# Patient Record
Sex: Male | Born: 1990 | Race: Black or African American | Hispanic: No | Marital: Single | Smoking: Former smoker
Health system: Southern US, Community
[De-identification: ages and names within clinical notes are randomized; demographics above are authoritative.]

## PROBLEM LIST (undated history)

## (undated) DIAGNOSIS — F329 Major depressive disorder, single episode, unspecified: Secondary | ICD-10-CM

## (undated) DIAGNOSIS — Z21 Asymptomatic human immunodeficiency virus [HIV] infection status: Secondary | ICD-10-CM

## (undated) DIAGNOSIS — F32A Depression, unspecified: Secondary | ICD-10-CM

## (undated) DIAGNOSIS — F4310 Post-traumatic stress disorder, unspecified: Secondary | ICD-10-CM

## (undated) HISTORY — DX: Major depressive disorder, single episode, unspecified: F32.9

## (undated) HISTORY — DX: Depression, unspecified: F32.A

## (undated) HISTORY — DX: Post-traumatic stress disorder, unspecified: F43.10

---

## 2015-12-29 ENCOUNTER — Encounter (HOSPITAL_COMMUNITY): Payer: Self-pay | Admitting: Emergency Medicine

## 2015-12-29 ENCOUNTER — Emergency Department (HOSPITAL_COMMUNITY)
Admission: EM | Admit: 2015-12-29 | Discharge: 2015-12-29 | Disposition: A | Discharge status: Home / Self Care | Payer: Self-pay | Attending: Emergency Medicine | Admitting: Emergency Medicine

## 2015-12-29 ENCOUNTER — Emergency Department (HOSPITAL_COMMUNITY): Payer: Self-pay

## 2015-12-29 DIAGNOSIS — R1110 Vomiting, unspecified: Secondary | ICD-10-CM | POA: Insufficient documentation

## 2015-12-29 DIAGNOSIS — R059 Cough, unspecified: Secondary | ICD-10-CM

## 2015-12-29 DIAGNOSIS — R0789 Other chest pain: Secondary | ICD-10-CM | POA: Insufficient documentation

## 2015-12-29 DIAGNOSIS — R197 Diarrhea, unspecified: Secondary | ICD-10-CM | POA: Insufficient documentation

## 2015-12-29 DIAGNOSIS — R0989 Other specified symptoms and signs involving the circulatory and respiratory systems: Secondary | ICD-10-CM

## 2015-12-29 DIAGNOSIS — Z21 Asymptomatic human immunodeficiency virus [HIV] infection status: Secondary | ICD-10-CM | POA: Insufficient documentation

## 2015-12-29 DIAGNOSIS — R05 Cough: Secondary | ICD-10-CM | POA: Insufficient documentation

## 2015-12-29 DIAGNOSIS — R053 Chronic cough: Secondary | ICD-10-CM

## 2015-12-29 DIAGNOSIS — R0601 Orthopnea: Secondary | ICD-10-CM

## 2015-12-29 DIAGNOSIS — F17200 Nicotine dependence, unspecified, uncomplicated: Secondary | ICD-10-CM | POA: Insufficient documentation

## 2015-12-29 DIAGNOSIS — R109 Unspecified abdominal pain: Secondary | ICD-10-CM | POA: Insufficient documentation

## 2015-12-29 HISTORY — DX: Asymptomatic human immunodeficiency virus (hiv) infection status: Z21

## 2015-12-29 LAB — CBC
HCT: 46 % (ref 39.0–52.0)
Hemoglobin: 15.5 g/dL (ref 13.0–17.0)
MCH: 30.5 pg (ref 26.0–34.0)
MCHC: 33.7 g/dL (ref 30.0–36.0)
MCV: 90.4 fL (ref 78.0–100.0)
PLATELETS: 266 10*3/uL (ref 150–400)
RBC: 5.09 MIL/uL (ref 4.22–5.81)
RDW: 12.6 % (ref 11.5–15.5)
WBC: 7.8 10*3/uL (ref 4.0–10.5)

## 2015-12-29 LAB — URINALYSIS, ROUTINE W REFLEX MICROSCOPIC
Bilirubin Urine: NEGATIVE
GLUCOSE, UA: NEGATIVE mg/dL
HGB URINE DIPSTICK: NEGATIVE
KETONES UR: 15 mg/dL — AB
LEUKOCYTES UA: NEGATIVE
Nitrite: NEGATIVE
PROTEIN: NEGATIVE mg/dL
Specific Gravity, Urine: 1.031 — ABNORMAL HIGH (ref 1.005–1.030)
pH: 7 (ref 5.0–8.0)

## 2015-12-29 LAB — COMPREHENSIVE METABOLIC PANEL
ALK PHOS: 102 U/L (ref 38–126)
ALT: 47 U/L (ref 17–63)
AST: 35 U/L (ref 15–41)
Albumin: 4.2 g/dL (ref 3.5–5.0)
Anion gap: 9 (ref 5–15)
BUN: 14 mg/dL (ref 6–20)
CALCIUM: 9.3 mg/dL (ref 8.9–10.3)
CO2: 24 mmol/L (ref 22–32)
CREATININE: 1.24 mg/dL (ref 0.61–1.24)
Chloride: 106 mmol/L (ref 101–111)
Glucose, Bld: 100 mg/dL — ABNORMAL HIGH (ref 65–99)
Potassium: 3.6 mmol/L (ref 3.5–5.1)
Sodium: 139 mmol/L (ref 135–145)
Total Bilirubin: 0.7 mg/dL (ref 0.3–1.2)
Total Protein: 7.2 g/dL (ref 6.5–8.1)

## 2015-12-29 LAB — LIPASE, BLOOD: Lipase: 27 U/L (ref 11–51)

## 2015-12-29 MED ORDER — DIPHENOXYLATE-ATROPINE 2.5-0.025 MG PO TABS: ORAL_TABLET | ORAL | Status: DC

## 2015-12-29 NOTE — Discharge Instructions (Signed)

## 2015-12-29 NOTE — ED Provider Notes (Signed)
CSN: [id number]     Arrival date & time 12/29/15  2041 History  By signing my name below, I, Jeremy Ortiz, attest that this documentation has been prepared under the direction and in the presence of Jeremy Ortiz, [place].  Electronically Signed: Tanda Ortiz, ED Scribe. 12/29/2015. 10:27 PM.   Chief Complaint  Patient presents with  . Emesis  . Diarrhea  . Cough   The history is provided by the patient. No language interpreter was used.     HPI Comments: Jeremy Ortiz is a 25 y.o. male with PMHx HIV who presents to the Emergency Department complaining of gradual onset, constant, productive cough x 2 weeks. Pt mentions that he recently had the flu and has been having the cough ever since. He is having chest tightness that is exacerbated with deep inspirations. He also complains of intermittent abdominal pain and vomiting that occurs only after eating. Pt has been having diarrhea for the pat 2 weeks as well. No recent antibiotic use. Pt has HIV but is new to the area from [place] and has not been able to get in to see a provider at [facility]. He last had his CD4 and viral load checked in December 2016. His CD4 count was in the 800's but he report he was not told a level for his viral load. Denies any other associated symptoms. Pt is current everyday smoker who smokes 1 ppd. No hx thrush or pneumonias. He has FHx of DM and stroke.   Past Medical History  Diagnosis Date  . HIV positive (HCC)    History reviewed. No pertinent past surgical history. No family history on file. Social History  Substance Use Topics  . Smoking status: Current Every Day Smoker  . Smokeless tobacco: None  . Alcohol Use: No    Review of Systems  Constitutional: Negative for fever.  Respiratory: Positive for cough and chest tightness.   Gastrointestinal: Positive for vomiting, abdominal pain and diarrhea.  All other systems reviewed and are negative.  Allergies  Review of patient's allergies  indicates no known allergies.  Home Medications   Prior to Admission medications   Not on File   BP 137/99 mmHg  Pulse 85  Temp(Src) 99.3 F (37.4 C) (Oral)  Resp 20  Ht  (1.702 m)  Wt 203 lb 1 oz (92.109 kg)  BMI 31.80 kg/m2  SpO2 99%   Physical Exam  Constitutional: He is oriented to person, place, and time. He appears well-developed and well-nourished. No distress.  HENT:  Head: Normocephalic and atraumatic.  Eyes: Conjunctivae and EOM are normal.  Neck: Neck supple. No tracheal deviation present.  Cardiovascular: Normal rate and regular rhythm.   Pulmonary/Chest: Effort normal and breath sounds normal. No respiratory distress. He has no wheezes. He has no rhonchi. He has no rales.  Musculoskeletal: Normal range of motion.  Neurological: He is alert and oriented to person, place, and time.  Skin: Skin is warm and dry.  Psychiatric: He has a normal mood and affect. His behavior is normal.  Nursing note and vitals reviewed.   ED Course  Procedures (including critical care time)  DIAGNOSTIC STUDIES: Oxygen Saturation is 99% on RA, normal by my interpretation.    COORDINATION OF CARE: 10:23 PM-Discussed treatment plan with pt at bedside and pt agreed to plan.   Labs Review Labs Reviewed  COMPREHENSIVE METABOLIC PANEL - Abnormal; Notable for the following:    Glucose, Bld 100 (*)    All other components within  normal limits  URINALYSIS, ROUTINE W REFLEX MICROSCOPIC (NOT AT [facility]) - Abnormal; Notable for the following:    Specific Gravity, Urine 1.031 (*)    Ketones, ur 15 (*)    All other components within normal limits  LIPASE, BLOOD  CBC    Imaging Review Dg Chest 2 View  12/29/2015  CLINICAL DATA:  25 year old male with persistent cough and chest congestion EXAM: CHEST  2 VIEW COMPARISON:  None. FINDINGS: The heart size and mediastinal contours are within normal limits. Both lungs are clear. The visualized skeletal structures are unremarkable. IMPRESSION:  No active cardiopulmonary disease. Electronically Signed   By: Elgie CollardArash  Radparvar M.D.   On: 12/29/2015 21:26   I have personally reviewed and evaluated these images and lab results as part of my medical decision-making.   EKG Interpretation None      MDM   Final diagnoses:  Orthopnea  Chest congestion  Persistent cough   Lomotil rx Wanda Case management [facility] in ID clinic.   An After Visit Summary was printed and given to the patient. I personally performed the services in this documentation, which was scribed in my presence.  The recorded information has been reviewed and considered.   Barnet PallKaren SofiaPAC.     Lonia SkinnerLeslie K San SabaSofia, PA-C 12/29/15 2243  Benjiman CoreNathan Pickering, MD 12/30/15 [phone]

## 2015-12-29 NOTE — Care Management (Signed)
ED CM met with patient to assist with follow up at the [facility]. Patient states, he has tried to contact. [facility]. CM sent an in-basket message to [place] at [facility] to contact patient at [phone], .

## 2015-12-29 NOTE — ED Notes (Signed)
Pt. reports emesis , diarrhea and productive cough for 2 weeks , denies fever or chills .

## 2016-01-25 ENCOUNTER — Telehealth: Payer: Self-pay

## 2016-01-25 NOTE — Telephone Encounter (Signed)
Several attempts to reach patient via phone.   Today I left a message on his voicemail to call the office for new intake appointment.   Laurell Josephsammy K Tram Wrenn, RN

## 2016-01-31 ENCOUNTER — Emergency Department (HOSPITAL_COMMUNITY)
Admission: EM | Admit: 2016-01-31 | Discharge: 2016-01-31 | Disposition: A | Discharge status: Home / Self Care | Payer: Self-pay | Attending: Emergency Medicine | Admitting: Emergency Medicine

## 2016-01-31 ENCOUNTER — Encounter (HOSPITAL_COMMUNITY): Payer: Self-pay | Admitting: Emergency Medicine

## 2016-01-31 DIAGNOSIS — H53149 Visual discomfort, unspecified: Secondary | ICD-10-CM | POA: Insufficient documentation

## 2016-01-31 DIAGNOSIS — R519 Headache, unspecified: Secondary | ICD-10-CM

## 2016-01-31 DIAGNOSIS — R110 Nausea: Secondary | ICD-10-CM | POA: Insufficient documentation

## 2016-01-31 DIAGNOSIS — Z21 Asymptomatic human immunodeficiency virus [HIV] infection status: Secondary | ICD-10-CM | POA: Insufficient documentation

## 2016-01-31 DIAGNOSIS — F17210 Nicotine dependence, cigarettes, uncomplicated: Secondary | ICD-10-CM | POA: Insufficient documentation

## 2016-01-31 DIAGNOSIS — R51 Headache: Secondary | ICD-10-CM | POA: Insufficient documentation

## 2016-01-31 MED ORDER — METOCLOPRAMIDE HCL 5 MG/ML IJ SOLN
10.0000 mg | Freq: Once | INTRAMUSCULAR | Status: AC
  Administered 2016-01-31: 10 mg via INTRAVENOUS
  Filled 2016-01-31: qty 2

## 2016-01-31 MED ORDER — ACETAMINOPHEN 500 MG PO TABS
1000.0000 mg | ORAL_TABLET | Freq: Once | ORAL | Status: AC
  Administered 2016-01-31: 1000 mg via ORAL
  Filled 2016-01-31: qty 2

## 2016-01-31 MED ORDER — DIPHENHYDRAMINE HCL 50 MG/ML IJ SOLN
25.0000 mg | Freq: Once | INTRAMUSCULAR | Status: AC
  Administered 2016-01-31: 25 mg via INTRAVENOUS
  Filled 2016-01-31: qty 1

## 2016-01-31 MED ORDER — SODIUM CHLORIDE 0.9 % IV BOLUS (SEPSIS)
1000.0000 mL | Freq: Once | INTRAVENOUS | Status: AC
  Administered 2016-01-31: 1000 mL via INTRAVENOUS

## 2016-01-31 NOTE — ED Notes (Addendum)
Pt here for migraine that started 3 days ago. Pt has not tired any medications at home. Pt denies nuchal rigidity.

## 2016-01-31 NOTE — ED Provider Notes (Signed)
CSN: [id number]     Arrival date & time 01/31/16  1443 History  By signing my name below, I, Soijett Blue, attest that this documentation has been prepared under the direction and in the presence of Chadwin Fury Y. Brynn Reznik, PA-C Electronically Signed: Soijett Blue, ED Scribe. 01/31/2016. 3:29 PM.   Chief Complaint  Patient presents with  . Migraine      The history is provided by the patient. No language interpreter was used.    HPI Comments: Jeremy Ortiz is a 25 y.o. male with a medical hx of HIV who presents to the Emergency Department complaining of 9/10, constant, gradually worsening, bilateral temporal, HA onset 3 days. He reports that his HA is worsened with lights and denies any alleviating factors.  Pt states that his HA is causing him to have issues with keeping his eyes open due to the pain. Pt denies typically getting headaches. He states that he is having associated symptoms of nausea and photophobia. He states that he has not tried any medications for the relief for his symptoms. He denies vomiting, blurred vision, vision change, fever, chills, and any other symptoms. Follows ID at [facility]. Last viral load undetectable in 01/2016. Pt is unsure of his last CD4 count and his next appointment with ID is Friday, 02/04/2016. Pt states that he is still taking his HIV medications.    Past Medical History  Diagnosis Date  . HIV positive (HCC)    History reviewed. No pertinent past surgical history. History reviewed. No pertinent family history. Social History  Substance Use Topics  . Smoking status: Current Every Day Smoker -- 1.00 packs/day    Types: Cigarettes  . Smokeless tobacco: None  . Alcohol Use: No    Review of Systems  Constitutional: Negative for fever and chills.  Eyes: Positive for photophobia. Negative for visual disturbance.  Gastrointestinal: Positive for nausea. Negative for vomiting.  Neurological: Positive for headaches.  All other systems reviewed and are  negative.     Allergies  Review of patient's allergies indicates no known allergies.  Home Medications   Prior to Admission medications   Medication Sig Start Date End Date Taking? Authorizing Provider  diphenoxylate-atropine (LOMOTIL) 2.5-0.025 MG tablet One tablet po q 4 prn diarrhea 12/29/15   Lonia Skinner Sofia, PA-C   BP 122/72 mmHg  Pulse 78  Temp(Src) 97.9 F (36.6 C) (Oral)  Resp 15  Ht  (1.702 m)  Wt 198 lb (89.812 kg)  BMI 31.00 kg/m2  SpO2 98% Physical Exam  Constitutional: He is oriented to person, place, and time. He appears well-developed and well-nourished. No distress.  HENT:  Head: Normocephalic and atraumatic.  Eyes: EOM are normal.  Neck: Normal range of motion. Neck supple.  No meningismus.  Cardiovascular: Normal rate and normal heart sounds.   Pulmonary/Chest: Effort normal and breath sounds normal. No respiratory distress.  Abdominal: He exhibits no distension.  Musculoskeletal: Normal range of motion.  Neurological: He is alert and oriented to person, place, and time. He has normal reflexes. No cranial nerve deficit. Coordination normal.   No pronator drift. Nl finger-to-nose.  Skin: Skin is warm and dry.  Psychiatric: He has a normal mood and affect. His behavior is normal.  Nursing note and vitals reviewed.   ED Course  Procedures (including critical care time) DIAGNOSTIC STUDIES: Oxygen Saturation is 98% on RA, nl by my interpretation.    COORDINATION OF CARE: 3:28 PM Discussed treatment plan with pt at bedside which includes CT head and  pt agreed to plan.    Labs Review Labs Reviewed - No data to display  Imaging Review No results found. I have personally reviewed and evaluated these images as part of my medical decision-making.   EKG Interpretation None      MDM   Final diagnoses:  Acute nonintractable headache, unspecified headache type    Pt feels improved after headache cocktail with intact neuro exam and no  meningeal signs. Discussed case with attending MD Schlossman. Given well-controlled HIV with close ID follow up, no e/o infection or neurological deficits on exam, will hold off on further imaging/workup at this time. Instructed to keep ID f/u for this week. Strict ER return precautions given.  I personally performed the services described in this documentation, which was scribed in my presence. The recorded information has been reviewed and is accurate.   Carlene CoriaSerena Y Kayana Thoen, PA-C 01/31/16 1720  Alvira MondayErin Schlossman, MD 02/01/16 [phone]

## 2016-01-31 NOTE — ED Notes (Signed)
Patient out of the room stating "I am burning up".  PA made aware.  See MAR for new orders.

## 2016-01-31 NOTE — Discharge Instructions (Signed)
Please follow up [facility]ious [facility] this week as scheduled. Return to the ER for new or worsening symptoms.

## 2016-04-12 ENCOUNTER — Emergency Department (HOSPITAL_COMMUNITY)
Admission: EM | Admit: 2016-04-12 | Discharge: 2016-04-12 | Disposition: A | Discharge status: Home / Self Care | Payer: Self-pay | Attending: Emergency Medicine | Admitting: Emergency Medicine

## 2016-04-12 ENCOUNTER — Emergency Department (HOSPITAL_COMMUNITY): Payer: Self-pay

## 2016-04-12 ENCOUNTER — Encounter (HOSPITAL_COMMUNITY): Payer: Self-pay | Admitting: Nurse Practitioner

## 2016-04-12 DIAGNOSIS — R109 Unspecified abdominal pain: Secondary | ICD-10-CM

## 2016-04-12 DIAGNOSIS — K5900 Constipation, unspecified: Secondary | ICD-10-CM | POA: Insufficient documentation

## 2016-04-12 DIAGNOSIS — F17210 Nicotine dependence, cigarettes, uncomplicated: Secondary | ICD-10-CM | POA: Insufficient documentation

## 2016-04-12 LAB — CBC
HCT: 48.7 % (ref 39.0–52.0)
HEMOGLOBIN: 16.1 g/dL (ref 13.0–17.0)
MCH: 30.6 pg (ref 26.0–34.0)
MCHC: 33.1 g/dL (ref 30.0–36.0)
MCV: 92.6 fL (ref 78.0–100.0)
Platelets: 253 10*3/uL (ref 150–400)
RBC: 5.26 MIL/uL (ref 4.22–5.81)
RDW: 13.4 % (ref 11.5–15.5)
WBC: 6.7 10*3/uL (ref 4.0–10.5)

## 2016-04-12 LAB — URINALYSIS, ROUTINE W REFLEX MICROSCOPIC
Bilirubin Urine: NEGATIVE
Glucose, UA: NEGATIVE mg/dL
HGB URINE DIPSTICK: NEGATIVE
Ketones, ur: NEGATIVE mg/dL
NITRITE: NEGATIVE
Protein, ur: NEGATIVE mg/dL
SPECIFIC GRAVITY, URINE: 1.03 (ref 1.005–1.030)
pH: 6 (ref 5.0–8.0)

## 2016-04-12 LAB — COMPREHENSIVE METABOLIC PANEL
ALK PHOS: 84 U/L (ref 38–126)
ALT: 33 U/L (ref 17–63)
ANION GAP: 7 (ref 5–15)
AST: 26 U/L (ref 15–41)
Albumin: 3.9 g/dL (ref 3.5–5.0)
BUN: 10 mg/dL (ref 6–20)
CALCIUM: 9.1 mg/dL (ref 8.9–10.3)
CO2: 22 mmol/L (ref 22–32)
Chloride: 109 mmol/L (ref 101–111)
Creatinine, Ser: 0.96 mg/dL (ref 0.61–1.24)
GFR calc non Af Amer: >60 mL/min (ref >60)
Glucose, Bld: 106 mg/dL — ABNORMAL HIGH (ref 65–99)
POTASSIUM: 3.8 mmol/L (ref 3.5–5.1)
SODIUM: 138 mmol/L (ref 135–145)
Total Bilirubin: 0.4 mg/dL (ref 0.3–1.2)
Total Protein: 6.8 g/dL (ref 6.5–8.1)

## 2016-04-12 LAB — URINE MICROSCOPIC-ADD ON: RBC / HPF: NONE SEEN RBC/hpf (ref 0–5)

## 2016-04-12 LAB — I-STAT CG4 LACTIC ACID, ED: LACTIC ACID, VENOUS: 0.81 mmol/L (ref 0.5–1.9)

## 2016-04-12 LAB — LIPASE, BLOOD: LIPASE: 18 U/L (ref 11–51)

## 2016-04-12 MED ORDER — HYDROCODONE-ACETAMINOPHEN 5-325 MG PO TABS
1.0000 | ORAL_TABLET | ORAL | Status: DC | PRN
  Administered 2016-04-12: 1 via ORAL
  Filled 2016-04-12: qty 1

## 2016-04-12 MED ORDER — GI COCKTAIL ~~LOC~~
30.0000 mL | Freq: Once | ORAL | Status: AC
  Administered 2016-04-12: 30 mL via ORAL
  Filled 2016-04-12: qty 30

## 2016-04-12 MED ORDER — OMEPRAZOLE 20 MG PO CPDR: 20.0000 mg | DELAYED_RELEASE_CAPSULE | Freq: Every day | ORAL | Status: DC

## 2016-04-12 MED ORDER — SODIUM CHLORIDE 0.9 % IV BOLUS (SEPSIS)
1000.0000 mL | Freq: Once | INTRAVENOUS | Status: AC
  Administered 2016-04-12: 1000 mL via INTRAVENOUS

## 2016-04-12 NOTE — ED Provider Notes (Signed)
CSN: [id number]     Arrival date & time 04/12/16  1406 History   First MD Initiated Contact with Patient 04/12/16 1643     Chief Complaint  Patient presents with  . Abdominal Pain     (Consider location/radiation/quality/duration/timing/severity/associated sxs/prior Treatment) Patient is a 25 y.o. male presenting with abdominal pain.  Abdominal Pain Pain location:  Generalized Pain quality: gnawing and sharp   Pain radiates to:  Chest Pain severity:  Moderate Onset quality:  Gradual Duration:  5 days Timing:  Constant Progression:  Unchanged Chronicity:  New Context: not alcohol use   Relieved by:  Nothing Worsened by:  Eating Ineffective treatments:  Bowel activity, acetaminophen, eating and position changes (pepto bismol) Associated symptoms: anorexia, chest pain (heart burn), constipation, dysuria and fatigue   Associated symptoms: no chills, no cough, no diarrhea, no fever, no hematemesis, no hematochezia, no hematuria, no nausea and no shortness of breath     Past Medical History  Diagnosis Date  . HIV positive (HCC)    History reviewed. No pertinent past surgical history. History reviewed. No pertinent family history. Social History  Substance Use Topics  . Smoking status: Current Every Day Smoker -- 1.00 packs/day    Types: Cigarettes  . Smokeless tobacco: None  . Alcohol Use: No    Review of Systems  Constitutional: Positive for fatigue. Negative for fever, chills and diaphoresis.  HENT: Negative for congestion.   Eyes: Negative for visual disturbance.  Respiratory: Negative for cough, chest tightness and shortness of breath.   Cardiovascular: Positive for chest pain (heart burn).  Gastrointestinal: Positive for abdominal pain, constipation and anorexia. Negative for nausea, diarrhea, hematochezia and hematemesis.  Genitourinary: Positive for dysuria. Negative for hematuria, flank pain, penile pain and testicular pain.  Musculoskeletal: Negative for neck  pain.  Skin: Negative for rash.  Neurological: Negative for weakness, numbness and headaches.  Psychiatric/Behavioral: Negative for confusion and agitation.      Allergies  Review of patient's allergies indicates no known allergies.  Home Medications   Prior to Admission medications   Medication Sig Start Date End Date Taking? Authorizing Provider  abacavir-dolutegravir-lamiVUDine (TRIUMEQ) 600-50-300 MG tablet Take 1 tablet by mouth at bedtime. 01/14/16  Yes Historical Provider, MD  diphenoxylate-atropine (LOMOTIL) 2.5-0.025 MG tablet One tablet po q 4 prn diarrhea Patient not taking: Reported on 04/12/2016 12/29/15   Elson AreasLeslie K Sofia, PA-C  divalproex (DEPAKOTE) 250 MG DR tablet Take 250 mg by mouth 2 (two) times daily. 03/17/16   Historical Provider, MD  omeprazole (PRILOSEC) 20 MG capsule Take 1 capsule (20 mg total) by mouth daily. 04/12/16   Levora AngelEric Deven Furia, MD  traMADol (ULTRAM) 50 MG tablet Take 50 mg by mouth daily as needed for moderate pain.  03/10/16   Historical Provider, MD   BP 129/65 mmHg  Pulse 50  Temp(Src) 98.8 F (37.1 C) (Oral)  Resp 16  SpO2 100% Physical Exam  Constitutional: He is oriented to person, place, and time. He appears well-developed and well-nourished. No distress.  HENT:  Head: Normocephalic and atraumatic.  Eyes: Conjunctivae are normal.  Cardiovascular: Normal rate and normal heart sounds.   No murmur heard. Pulmonary/Chest: Effort normal and breath sounds normal.  Abdominal: Soft. He exhibits no distension. There is tenderness (mild generalized tenderness, equivocal Murphy sign). There is no rebound and no guarding.  Musculoskeletal: He exhibits no edema.  Neurological: He is alert and oriented to person, place, and time.  Skin: Skin is warm. He is not diaphoretic.  Psychiatric: He  has a normal mood and affect. His behavior is normal.  Nursing note and vitals reviewed.   ED Course  Procedures (including critical care time) Labs Review Labs  Reviewed  COMPREHENSIVE METABOLIC PANEL - Abnormal; Notable for the following:    Glucose, Bld 106 (*)    All other components within normal limits  URINALYSIS, ROUTINE W REFLEX MICROSCOPIC (NOT AT [facility]) - Abnormal; Notable for the following:    Leukocytes, UA SMALL (*)    All other components within normal limits  URINE MICROSCOPIC-ADD ON - Abnormal; Notable for the following:    Squamous Epithelial / LPF 0-5 (*)    Bacteria, UA FEW (*)    All other components within normal limits  CBC  LIPASE, BLOOD  T-HELPER CELLS (CD4) COUNT (NOT AT [facility])  I-STAT CG4 LACTIC ACID, ED  GC/CHLAMYDIA PROBE AMP ([place]) NOT AT [facility]    Imaging Review Dg Abd 1 View  04/12/2016  CLINICAL DATA:  Intermittent sharp abdominal pain and nausea over the last 5 days. EXAM: ABDOMEN - 1 VIEW COMPARISON:  None. FINDINGS: The bowel gas pattern is normal. No radio-opaque calculi or other significant radiographic abnormality are seen. IMPRESSION: Negative radiographs of the abdomen Electronically Signed   By: Marin Roberts M.D.   On: 04/12/2016 18:27   US Abdomen Limited Ruq  04/12/2016  CLINICAL DATA:  Abdominal pain. EXAM: US ABDOMEN LIMITED - RIGHT UPPER QUADRANT COMPARISON:  None. FINDINGS: Gallbladder: No gallstones or gallbladder wall thickening. No pericholecystic fluid. The sonographer reports no sonographic Murphy's sign. Common bile duct: Diameter: 2 mm Liver: No focal lesion identified. Within normal limits in parenchymal echogenicity. IMPRESSION: Normal right upper quadrant limited ultrasound. Electronically Signed   By: Kennith Center M.D.   On: 04/12/2016 18:34   I have personally reviewed and evaluated these images and lab results as part of my medical decision-making.   EKG Interpretation None      MDM   Final diagnoses:  Abdominal pain  Constipation, unspecified constipation type   Tamera Stands presents for 5 days waxing and waning abdominal pain. Generalized, sometimes made  worse by eating. RUQ ultrasound unremarkable. KUB shows no acute pathology with moderate stool burden. Pain not improved with GI cocktail. Abdominal and testicular exams are benign. Patient concerned symptoms could be related to HIV. Reports good medication compliance. CD4 levels sent. Urinalysis unremarkable - I'm unsure the etiology of the patient's dysuria. GC/Chlamydia urine probe pending, but holding empiric treatment at this time due to no history of penile discharge or known exposure. Patient able to ambulate without difficulty in ED. Discharged home in good condition with strict return precautions and instructions to follow up with PCP (information provided). Prescribed omeprazole for possible GERD and encouraged to use metamucil/miralax for constipation.      Levora Angel, MD 04/13/16 1436  Alvira Monday, MD 04/17/16 1442

## 2016-04-12 NOTE — Discharge Instructions (Signed)
Please return to the emergency department if your pain significantly worsens, you develope a fever, or your aren't able to eat. I recommend using a stool softener like MiraLAX or Metamucil to help with your bowel movements. I suspect a lot of your pain is due to constipation, but if the pain gets worse or won't stop even after you've had good bowel movements, we would like to see you back.   Abdominal Pain, Adult Many things can cause abdominal pain. Usually, abdominal pain is not caused by a disease and will improve without treatment. It can often be observed and treated at home. Your health care provider will do a physical exam and possibly order blood tests and X-rays to help determine the seriousness of your pain. However, in many cases, more time must pass before a clear cause of the pain can be found. Before that point, your health care provider may not know if you need more testing or further treatment. HOME CARE INSTRUCTIONS Monitor your abdominal pain for any changes. The following actions may help to alleviate any discomfort you are experiencing:  Only take over-the-counter or prescription medicines as directed by your health care provider.  Do not take laxatives unless directed to do so by your health care provider.  Try a clear liquid diet (broth, tea, or water) as directed by your health care provider. Slowly move to a bland diet as tolerated. SEEK MEDICAL CARE IF:  You have unexplained abdominal pain.  You have abdominal pain associated with nausea or diarrhea.  You have pain when you urinate or have a bowel movement.  You experience abdominal pain that wakes you in the night.  You have abdominal pain that is worsened or improved by eating food.  You have abdominal pain that is worsened with eating fatty foods.  You have a fever. SEEK IMMEDIATE MEDICAL CARE IF:  Your pain does not go away within 2 hours.  You keep throwing up (vomiting).  Your pain is felt only in  portions of the abdomen, such as the right side or the left lower portion of the abdomen.  You pass bloody or black tarry stools. MAKE SURE YOU:  Understand these instructions.  Will watch your condition.  Will get help right away if you are not doing well or get worse.   This information is not intended to replace advice given to you by your health care provider. Make sure you discuss any questions you have with your health care provider.   Document Released: 06/28/2005 Document Revised: 06/09/2015 Document Reviewed: 05/28/2013 Elsevier Interactive Patient Education [employer]2016 [employer]vier Inc.

## 2016-04-12 NOTE — ED Notes (Signed)
Pt inquiring about his status. Wanting to know what is going to happen. Pt advised that i was unsure but i would speak with the nurse. Rn informed

## 2016-04-12 NOTE — ED Notes (Signed)
Provider at bedside

## 2016-04-12 NOTE — ED Notes (Signed)
He c/o 5 day history of abd pain. Describes as constant pain with intermittent sharp pain. Reports nausea, dysuria. Denies vomiting, fevers. Reports he feels like he needs to have BM constantly but only has small stools. He is alert, breathing easily

## 2016-04-12 NOTE — ED Notes (Signed)
Provider made aware of lipase results

## 2016-04-13 LAB — T-HELPER CELLS (CD4) COUNT (NOT AT ARMC)
CD4 % Helper T Cell: 46 % (ref 33–55)
CD4 T CELL ABS: 1770 /uL (ref 400–2700)

## 2016-05-03 ENCOUNTER — Encounter: Payer: Self-pay | Admitting: Internal Medicine

## 2016-05-09 ENCOUNTER — Other Ambulatory Visit: Payer: Self-pay

## 2016-05-30 ENCOUNTER — Ambulatory Visit: Payer: Self-pay | Admitting: Internal Medicine

## 2016-09-06 ENCOUNTER — Telehealth: Payer: Self-pay | Admitting: Lab

## 2016-09-06 NOTE — Telephone Encounter (Signed)
Patient missed his appointment with Dr. Luciana Axeomer on 05/30/16.  I tried calling him 3x and the phone would ring a few times and then go to fast busy.  I left a message for [facility] to give me a call (xt [phone] on paperwork but there isn't a name)

## 2016-10-11 NOTE — Telephone Encounter (Signed)
Per [facility], [place]

## 2018-04-07 ENCOUNTER — Encounter (HOSPITAL_COMMUNITY): Payer: Self-pay | Admitting: Emergency Medicine

## 2018-04-07 ENCOUNTER — Emergency Department (HOSPITAL_COMMUNITY)
Admission: EM | Admit: 2018-04-07 | Discharge: 2018-04-08 | Disposition: A | Discharge status: Home / Self Care | Payer: Self-pay | Attending: Emergency Medicine | Admitting: Emergency Medicine

## 2018-04-07 ENCOUNTER — Emergency Department (HOSPITAL_COMMUNITY): Payer: Self-pay

## 2018-04-07 DIAGNOSIS — B20 Human immunodeficiency virus [HIV] disease: Secondary | ICD-10-CM | POA: Insufficient documentation

## 2018-04-07 DIAGNOSIS — Z9114 Patient's other noncompliance with medication regimen: Secondary | ICD-10-CM | POA: Insufficient documentation

## 2018-04-07 DIAGNOSIS — F17210 Nicotine dependence, cigarettes, uncomplicated: Secondary | ICD-10-CM | POA: Insufficient documentation

## 2018-04-07 DIAGNOSIS — Z79899 Other long term (current) drug therapy: Secondary | ICD-10-CM | POA: Insufficient documentation

## 2018-04-07 DIAGNOSIS — R05 Cough: Secondary | ICD-10-CM | POA: Insufficient documentation

## 2018-04-07 DIAGNOSIS — Z91148 Patient's other noncompliance with medication regimen for other reason: Secondary | ICD-10-CM

## 2018-04-07 DIAGNOSIS — R059 Cough, unspecified: Secondary | ICD-10-CM

## 2018-04-07 DIAGNOSIS — R609 Edema, unspecified: Secondary | ICD-10-CM | POA: Insufficient documentation

## 2018-04-07 DIAGNOSIS — R079 Chest pain, unspecified: Secondary | ICD-10-CM

## 2018-04-07 LAB — CBC
HEMATOCRIT: 46.2 % (ref 39.0–52.0)
HEMOGLOBIN: 14.7 g/dL (ref 13.0–17.0)
MCH: 29.9 pg (ref 26.0–34.0)
MCHC: 31.8 g/dL (ref 30.0–36.0)
MCV: 93.9 fL (ref 78.0–100.0)
Platelets: 246 10*3/uL (ref 150–400)
RBC: 4.92 MIL/uL (ref 4.22–5.81)
RDW: 12.8 % (ref 11.5–15.5)
WBC: 6.5 10*3/uL (ref 4.0–10.5)

## 2018-04-07 LAB — BASIC METABOLIC PANEL
ANION GAP: 7 (ref 5–15)
BUN: 10 mg/dL (ref 6–20)
CALCIUM: 9 mg/dL (ref 8.9–10.3)
CHLORIDE: 105 mmol/L (ref 98–111)
CO2: 24 mmol/L (ref 22–32)
Creatinine, Ser: 1.02 mg/dL (ref 0.61–1.24)
GFR calc non Af Amer: >60 mL/min (ref >60)
GLUCOSE: 107 mg/dL — AB (ref 70–99)
POTASSIUM: 3.8 mmol/L (ref 3.5–5.1)
Sodium: 136 mmol/L (ref 135–145)

## 2018-04-07 LAB — I-STAT TROPONIN, ED: TROPONIN I, POC: 0 ng/mL (ref 0.00–0.08)

## 2018-04-07 MED ORDER — AEROCHAMBER PLUS FLO-VU LARGE MISC: 1.0000 | Freq: Once | Status: DC

## 2018-04-07 MED ORDER — ALBUTEROL SULFATE HFA 108 (90 BASE) MCG/ACT IN AERS
2.0000 | INHALATION_SPRAY | RESPIRATORY_TRACT | Status: DC | PRN
  Administered 2018-04-07: 2 via RESPIRATORY_TRACT
  Filled 2018-04-07: qty 6.7

## 2018-04-07 NOTE — ED Provider Notes (Signed)
[facility] EMERGENCY DEPARTMENT Provider Note   CSN: [id number] Arrival date & time: 04/07/18  2008     History   Chief Complaint Chief Complaint  Patient presents with  . Chest Pain    HPI Jeremy Ortiz is a 27 y.o. male with a hx of HIV (followed at [facility])  presents to the Emergency Department complaining of gradual, persistent, progressively worsening central chest pain onset 3 days ago.  Pt reports pain is similar to previous episodes of PNA. He reports this required a CT scan as his x-ray was normal. Pt reports the pain is constant, mostly on the left but with some movement to the right side.  No alleviating factors.  Pt reports the pain is "inside" and is worse with deep breathing.  Pt denies travel, leg swelling, hx of DVT, lupus.  Pt reports associated nasal congestion, cough, subjective chills, SOB,   Pt denies fever, headache, neck pain, neck stiffness, weakness, dizziness, syncope, abd pain, N/V/D. Pt reports he smoke 1ppd.  Patient does complain of some generalized swelling in his legs and hands over the last several weeks.  He denies aggravating or alleviating factors for this.  No calf tenderness.  Patient was complaining of generalized arthralgias.  He reports this is been going on for years.  It is unchanged today.  Pt reports he has been treated in [place] for his HIV.  Pt reports his viral load was 89,000 and his CD4 was above 600 in May 2019 when he was admitted for pneumonia.  Pt reports since that time he has only taken 1 week of medicine.  He reports he was in a stressful place and "gave up."  He reports he feels better now and is not Suicidal.  He reports he moved back to [place] to try to get back on track with his health.     The history is provided by the patient and medical records. No language interpreter was used.    Past Medical History:  Diagnosis Date  . HIV positive (HCC)     There are no active problems to display for this  patient.   History reviewed. No pertinent surgical history.      Home Medications    Prior to Admission medications   Medication Sig Start Date End Date Taking? Authorizing Provider  abacavir-dolutegravir-lamiVUDine (TRIUMEQ) 600-50-300 MG tablet Take 1 tablet by mouth at bedtime. 01/14/16   [provider]  azithromycin (ZITHROMAX) 250 MG tablet Take 1 tablet (250 mg total) by mouth daily. Take first 2 tablets together, then 1 every day until finished. 04/08/18   Atoya Andrew, Dahlia Client, PA-C  diphenoxylate-atropine (LOMOTIL) 2.5-0.025 MG tablet One tablet po q 4 prn diarrhea Patient not taking: Reported on 04/12/2016 12/29/15   Elson Areas, PA-C  divalproex (DEPAKOTE) 250 MG DR tablet Take 250 mg by mouth 2 (two) times daily. 03/17/16   [provider]  omeprazole (PRILOSEC) 20 MG capsule Take 1 capsule (20 mg total) by mouth daily. 04/12/16   Levora Angel, MD  traMADol (ULTRAM) 50 MG tablet Take 50 mg by mouth daily as needed for moderate pain.  03/10/16   [provider]    Family History History reviewed. No pertinent family history.  Social History Social History   Tobacco Use  . Smoking status: Current Every Day Smoker    Packs/day: 1.00    Types: Cigarettes  . Smokeless tobacco: Never Used  Substance Use Topics  . Alcohol use: No  . Drug use:  Yes    Types: Marijuana     Allergies   Patient has no known allergies.   Review of Systems Review of Systems  Constitutional: Negative for appetite change, diaphoresis, fatigue, fever and unexpected weight change.  HENT: Positive for congestion. Negative for mouth sores.   Eyes: Negative for visual disturbance.  Respiratory: Positive for cough, chest tightness and shortness of breath. Negative for wheezing.   Cardiovascular: Positive for chest pain and leg swelling.  Gastrointestinal: Negative for abdominal pain, constipation, diarrhea, nausea and vomiting.  Endocrine: Negative for polydipsia,  polyphagia and polyuria.  Genitourinary: Negative for dysuria, frequency, hematuria and urgency.  Musculoskeletal: Positive for arthralgias ( Chronic). Negative for back pain and neck stiffness.  Skin: Negative for rash.  Allergic/Immunologic: Negative for immunocompromised state.  Neurological: Negative for syncope, light-headedness and headaches.  Hematological: Does not bruise/bleed easily.  Psychiatric/Behavioral: Negative for sleep disturbance. The patient is not nervous/anxious.      Physical Exam Updated Vital Signs BP 135/75   Pulse 69   Temp (!) 97.3 F (36.3 C) (Oral)   Resp (!) 26   Ht 5\' 8"  (1.727 m)   Wt 97.5 kg (215 lb)   SpO2 100%   BMI 32.69 kg/m   Physical Exam  Constitutional: He appears well-developed and well-nourished. No distress.  Awake, alert, nontoxic appearance  HENT:  Head: Normocephalic and atraumatic.  Mouth/Throat: Oropharynx is clear and moist. No oropharyngeal exudate.  Eyes: Conjunctivae are normal. No scleral icterus.  Neck: Normal range of motion. Neck supple.  Cardiovascular: Normal rate, regular rhythm and intact distal pulses.  Pulses:      Radial pulses are 2+ on the right side, and 2+ on the left side.       Posterior tibial pulses are 2+ on the right side, and 2+ on the left side.  Pulmonary/Chest: Effort normal. No respiratory distress. He has decreased breath sounds. He has no wheezes.  Equal chest expansion Diminished throughout, no focal rhonchi, rales or wheezes.  Abdominal: Soft. Bowel sounds are normal. He exhibits no mass. There is no tenderness. There is no rigidity, no rebound, no guarding and no CVA tenderness.  Musculoskeletal: Normal range of motion. He exhibits edema ( Trace, nonpitting, bilateral lower extremity edema).  Neurological: He is alert.  Speech is clear and goal oriented Moves extremities without ataxia  Skin: Skin is warm and dry. He is not diaphoretic.  Psychiatric: He has a normal mood and affect.   Nursing note and vitals reviewed.    ED Treatments / Results  Labs (all labs ordered are listed, but only abnormal results are displayed) Labs Reviewed  BASIC METABOLIC PANEL - Abnormal; Notable for the following components:      Result Value   Glucose, Bld 107 (*)    All other components within normal limits  CBC  CBC WITH DIFFERENTIAL/PLATELET  I-STAT TROPONIN, ED    EKG EKG Interpretation  Date/Time:  Sunday April 07 2018 20:17:16 EDT Ventricular Rate:  72 PR Interval:  160 QRS Duration: 88 QT Interval:  376 QTC Calculation: 411 R Axis:   -28 Text Interpretation:  Normal sinus rhythm Confirmed by Nicanor Alcon, April ([id number]) on 04/07/2018 11:34:43 PM   Radiology Dg Chest 2 View  Result Date: 04/07/2018 CLINICAL DATA:  Central chest pain radiating LEFT and RIGHT for 3 days, nonproductive cough for 3 days, smoker, HIV positive EXAM: CHEST - 2 VIEW COMPARISON:  12/28/2017 FINDINGS: Normal heart size, mediastinal contours, and pulmonary vascularity. Lungs clear. No pleural effusion  or pneumothorax. Bones unremarkable. IMPRESSION: No acute abnormalities. Electronically Signed   By: Ulyses SouthwardMark  Boles M.D.   On: 04/07/2018 20:40   Ct Angio Chest Pe W And/or Wo Contrast  Result Date: 04/08/2018 CLINICAL DATA:  Initial evaluation for acute chest pain, productive cough. EXAM: CT ANGIOGRAPHY CHEST WITH CONTRAST TECHNIQUE: Multidetector CT imaging of the chest was performed using the standard protocol during bolus administration of intravenous contrast. Multiplanar CT image reconstructions and MIPs were obtained to evaluate the vascular anatomy. CONTRAST:  100mL ISOVUE-370 IOPAMIDOL (ISOVUE-370) INJECTION 76% COMPARISON:  Prior radiograph from 04/07/2018 FINDINGS: Cardiovascular: Intrathoracic aorta of normal caliber without aneurysm or other acute abnormality. Partially visualized great vessels within normal limits. Heart size within normal limits. No pericardial effusion. Pulmonary arterial tree  adequately opacified for evaluation. Main pulmonary artery within normal limits for caliber. No filling defect to suggest acute pulmonary embolism. Re-formatted imaging confirms these findings. Mediastinum/Nodes: Visualized thyroid normal. No enlarged mediastinal, hilar, or axillary lymph nodes identified. Esophagus within normal limits. Lungs/Pleura: Tracheobronchial tree intact and patent. Lungs well inflated bilaterally. No focal infiltrates. No pulmonary edema or pleural effusion. No pneumothorax. No worrisome pulmonary nodule or mass. Mild subsegmental atelectatic changes present dependently within the lower lobes bilaterally. Upper Abdomen: Visualized upper abdomen demonstrates no acute finding. Musculoskeletal: No acute osseous abnormality. No discrete lytic or blastic osseous lesions. Review of the MIP images confirms the above findings. IMPRESSION: 1. No CT evidence for acute pulmonary embolism. 2. No other acute cardiopulmonary abnormality identified. Electronically Signed   By: Rise MuBenjamin  McClintock M.D.   On: 04/08/2018 01:06    Procedures Procedures (including critical care time)  Medications Ordered in ED Medications  iopamidol (ISOVUE-370) 76 % injection 100 mL (100 mLs Intravenous Contrast Given 04/08/18 0029)     Initial Impression / Assessment and Plan / ED Course  I have reviewed the triage vital signs and the nursing notes.  Pertinent labs & imaging results that were available during my care of the patient were reviewed by me and considered in my medical decision making (see chart for details).     Presents with 3 days of constant chest pain.  He reports this is in the setting of cough, congestion, chest tightness and shortness of breath.  He reports he was recently treated for pneumonia.  Chest x-ray is without evidence of pneumonia, pneumothorax or pulmonary edema.  I personally evaluated these images.  Patient's basic labs are reassuring.  There is no evidence of sirs or  sepsis.  Patient is without tachycardia and clinical exam is not concerning for DVT.  Given albuterol inhaler for diminished breath sounds.  Repeat lung exam shows increased tidal volume and lung sounds remain nonfocal.  Opponent is negative and EKG is nonischemic.  With 3 days of consistent chest pain doubt acute coronary syndrome today.  Patient is HIV positive and has been noncompliant with his medications.  I am unable to see his last hospitalization however he does report this was for pneumonia in [place].  Unable to see patient's recent labs, CD4 count and HIV status.  CT scan obtained to further evaluate for possibility of pulmonary embolism or atypical pneumonia.  CT scan is reassuring without evidence of either.  Patient will be given azithromycin for possible community-acquired pneumonia.  His arthralgias are chronic and his peripheral edema is not consistent with DVT or congestive heart failure.  Chest x-ray was without evidence of pulmonary embolism.  Discussed the importance of close follow-up with primary care and infectious disease  here in [place] if this is where he is going to establish care.  I discussed the importance of patient compliance and the potential for immunocompromise and opportunistic infections.  Patient states understanding and is in agreement with the plan to follow-up with infectious disease.  Final Clinical Impressions(s) / ED Diagnoses   Final diagnoses:  Central chest pain  Cough  Noncompliance with medications    ED Discharge Orders        Ordered    azithromycin (ZITHROMAX) 250 MG tablet  Daily     04/08/18 0135       Ahsley Attwood, Dahlia Client, PA-C 04/08/18 0546    Palumbo, April, MD 04/08/18 [id number]

## 2018-04-07 NOTE — ED Notes (Signed)
ED Provider at bedside. 

## 2018-04-07 NOTE — ED Notes (Signed)
Pt states he is HIV positive and has not been compliant with his medications for 4-5 months. Pt moved from [place] 2 days ago and does not have a PCP in [place] at this time, but would like one.  Pt has pains in all of his joints but primarily in his hips when he walks.

## 2018-04-07 NOTE — ED Triage Notes (Signed)
Reports chest pain that radiates from center to left and right for three days.  Also endorses a non productive cough for three days.  Hx of same.  Reports it was pneumonia last times.  Denies having any fever.  Also endorses feeling weak.

## 2018-04-08 ENCOUNTER — Emergency Department (HOSPITAL_COMMUNITY): Payer: Self-pay

## 2018-04-08 LAB — CBC WITH DIFFERENTIAL/PLATELET
ABS IMMATURE GRANULOCYTES: 0 10*3/uL (ref 0.0–0.1)
BASOS ABS: 0.1 10*3/uL (ref 0.0–0.1)
BASOS PCT: 1 %
Eosinophils Absolute: 0.3 10*3/uL (ref 0.0–0.7)
Eosinophils Relative: 4 %
HCT: 45.1 % (ref 39.0–52.0)
HEMOGLOBIN: 14.5 g/dL (ref 13.0–17.0)
IMMATURE GRANULOCYTES: 0 %
LYMPHS PCT: 53 %
Lymphs Abs: 3.7 10*3/uL (ref 0.7–4.0)
MCH: 30 pg (ref 26.0–34.0)
MCHC: 32.2 g/dL (ref 30.0–36.0)
MCV: 93.4 fL (ref 78.0–100.0)
MONO ABS: 0.5 10*3/uL (ref 0.1–1.0)
Monocytes Relative: 7 %
NEUTROS ABS: 2.4 10*3/uL (ref 1.7–7.7)
NEUTROS PCT: 35 %
PLATELETS: 235 10*3/uL (ref 150–400)
RBC: 4.83 MIL/uL (ref 4.22–5.81)
RDW: 12.8 % (ref 11.5–15.5)
WBC: 6.8 10*3/uL (ref 4.0–10.5)

## 2018-04-08 MED ORDER — IOPAMIDOL (ISOVUE-370) INJECTION 76%
100.0000 mL | Freq: Once | INTRAVENOUS | Status: AC | PRN
  Administered 2018-04-08: 100 mL via INTRAVENOUS

## 2018-04-08 MED ORDER — AZITHROMYCIN 250 MG PO TABS: 250.0000 mg | ORAL_TABLET | Freq: Every day | ORAL | 0 refills | Status: DC

## 2018-04-08 NOTE — Discharge Instructions (Addendum)
1. Medications: Azithromycin, albuterol, usual home medications 2. Treatment: rest, drink plenty of fluids,  3. Follow Up: Please followup with your primary doctor in 1-2 days for discussion of your diagnoses and further evaluation after today's visit; if you do not have a primary care doctor use the resource guide provided to find one; Please return to the ER for fevers, worsening pain, worsening shortness of breath or other concerns

## 2018-04-08 NOTE — ED Notes (Signed)
Patient transported to CT 

## 2018-04-29 ENCOUNTER — Ambulatory Visit: Payer: Self-pay

## 2018-04-29 ENCOUNTER — Other Ambulatory Visit: Payer: Self-pay

## 2018-04-29 DIAGNOSIS — Z113 Encounter for screening for infections with a predominantly sexual mode of transmission: Secondary | ICD-10-CM

## 2018-04-29 DIAGNOSIS — Z21 Asymptomatic human immunodeficiency virus [HIV] infection status: Secondary | ICD-10-CM

## 2018-04-29 DIAGNOSIS — B20 Human immunodeficiency virus [HIV] disease: Secondary | ICD-10-CM

## 2018-04-29 DIAGNOSIS — Z79899 Other long term (current) drug therapy: Secondary | ICD-10-CM

## 2018-04-29 LAB — URINALYSIS, ROUTINE W REFLEX MICROSCOPIC
Bilirubin Urine: NEGATIVE
GLUCOSE, UA: NEGATIVE mg/dL
Hgb urine dipstick: NEGATIVE
KETONES UR: NEGATIVE mg/dL
Nitrite: NEGATIVE
PROTEIN: NEGATIVE mg/dL
Specific Gravity, Urine: 1.025 (ref 1.005–1.030)
pH: 5 (ref 5.0–8.0)

## 2018-04-30 LAB — QUANTIFERON-TB GOLD PLUS
Mitogen-NIL: >10 IU/mL
NIL: 0.07 IU/mL
QuantiFERON-TB Gold Plus: NEGATIVE
TB1-NIL: 0.01 IU/mL
TB2-NIL: <0 IU/mL

## 2018-04-30 LAB — T-HELPER CELL (CD4) - (RCID CLINIC ONLY)
CD4 T CELL ABS: 1330 /uL (ref 400–2700)
CD4 T CELL HELPER: 44 % (ref 33–55)

## 2018-05-01 ENCOUNTER — Emergency Department (HOSPITAL_COMMUNITY)
Admission: EM | Admit: 2018-05-01 | Discharge: 2018-05-01 | Disposition: A | Discharge status: Home / Self Care | Payer: Self-pay | Attending: Emergency Medicine | Admitting: Emergency Medicine

## 2018-05-01 ENCOUNTER — Encounter (HOSPITAL_COMMUNITY): Payer: Self-pay | Admitting: Emergency Medicine

## 2018-05-01 ENCOUNTER — Other Ambulatory Visit: Payer: Self-pay

## 2018-05-01 ENCOUNTER — Emergency Department (HOSPITAL_COMMUNITY): Payer: Self-pay

## 2018-05-01 DIAGNOSIS — B20 Human immunodeficiency virus [HIV] disease: Secondary | ICD-10-CM | POA: Insufficient documentation

## 2018-05-01 DIAGNOSIS — R0789 Other chest pain: Secondary | ICD-10-CM | POA: Insufficient documentation

## 2018-05-01 DIAGNOSIS — Z79899 Other long term (current) drug therapy: Secondary | ICD-10-CM | POA: Insufficient documentation

## 2018-05-01 DIAGNOSIS — F17210 Nicotine dependence, cigarettes, uncomplicated: Secondary | ICD-10-CM | POA: Insufficient documentation

## 2018-05-01 LAB — CBC
HEMATOCRIT: 47.4 % (ref 39.0–52.0)
HEMOGLOBIN: 15.6 g/dL (ref 13.0–17.0)
MCH: 30.3 pg (ref 26.0–34.0)
MCHC: 32.9 g/dL (ref 30.0–36.0)
MCV: 92 fL (ref 78.0–100.0)
Platelets: 243 10*3/uL (ref 150–400)
RBC: 5.15 MIL/uL (ref 4.22–5.81)
RDW: 12.9 % (ref 11.5–15.5)
WBC: 6.7 10*3/uL (ref 4.0–10.5)

## 2018-05-01 LAB — BASIC METABOLIC PANEL
ANION GAP: 8 (ref 5–15)
BUN: 9 mg/dL (ref 6–20)
CALCIUM: 8.9 mg/dL (ref 8.9–10.3)
CHLORIDE: 105 mmol/L (ref 98–111)
CO2: 25 mmol/L (ref 22–32)
Creatinine, Ser: 1.08 mg/dL (ref 0.61–1.24)
GFR calc non Af Amer: >60 mL/min (ref >60)
GLUCOSE: 109 mg/dL — AB (ref 70–99)
POTASSIUM: 3.9 mmol/L (ref 3.5–5.1)
Sodium: 138 mmol/L (ref 135–145)

## 2018-05-01 LAB — URINE CYTOLOGY ANCILLARY ONLY
Chlamydia: NEGATIVE
Neisseria Gonorrhea: NEGATIVE

## 2018-05-01 LAB — I-STAT TROPONIN, ED: TROPONIN I, POC: 0 ng/mL (ref 0.00–0.08)

## 2018-05-01 MED ORDER — TRAMADOL HCL 50 MG PO TABS: 50.0000 mg | ORAL_TABLET | Freq: Four times a day (QID) | ORAL | 0 refills | Status: DC | PRN

## 2018-05-01 NOTE — Discharge Instructions (Addendum)
Follow-up with your family doctor as planned in the next couple weeks for recheck

## 2018-05-01 NOTE — ED Triage Notes (Signed)
Patient complains of sharp, stabbing, chest pain since the beginning of the month. States pain continued and then two days ago started also feeling lightheaded. Patient denies any other symptoms. Patient alert, oriented, and in no apparent distress at this time.

## 2018-05-01 NOTE — ED Provider Notes (Signed)
[facility] EMERGENCY DEPARTMENT Provider Note   CSN: [id number] Arrival date & time: 05/01/18  [id number]     History   Chief Complaint Chief Complaint  Patient presents with  . Chest Pain    HPI Tamera StandsChristopher Godette is a 27 y.o. male.  Patient states that he has been having left-sided chest pain worse when he lifts his arm up in the air.  Patient drives a truck.  No shortness of breath no wheezing no cough no fever  The history is provided by the patient. No language interpreter was used.  Chest Pain   This is a new problem. The current episode started more than 1 week ago. The problem occurs constantly. The problem has not changed since onset.The pain is associated with movement. The pain is present in the substernal region. The pain is at a severity of 5/10. The pain is moderate. The quality of the pain is described as dull. The pain does not radiate. Pertinent negatives include no abdominal pain, no back pain, no cough and no headaches.  Pertinent negatives for past medical history include no seizures.    Past Medical History:  Diagnosis Date  . HIV positive (HCC)     There are no active problems to display for this patient.   History reviewed. No pertinent surgical history.      Home Medications    Prior to Admission medications   Medication Sig Start Date End Date Taking? Authorizing Provider  abacavir-dolutegravir-lamiVUDine (TRIUMEQ) 600-50-300 MG tablet Take 1 tablet by mouth at bedtime. 01/14/16   [provider]  azithromycin (ZITHROMAX) 250 MG tablet Take 1 tablet (250 mg total) by mouth daily. Take first 2 tablets together, then 1 every day until finished. 04/08/18   Muthersbaugh, Dahlia ClientHannah, PA-C  diphenoxylate-atropine (LOMOTIL) 2.5-0.025 MG tablet One tablet po q 4 prn diarrhea Patient not taking: Reported on 04/12/2016 12/29/15   Elson AreasSofia, Leslie K, PA-C  divalproex (DEPAKOTE) 250 MG DR tablet Take 250 mg by mouth 2 (two) times daily. 03/17/16    [provider]  omeprazole (PRILOSEC) 20 MG capsule Take 1 capsule (20 mg total) by mouth daily. 04/12/16   Levora AngelEngstrom, Eric, MD  traMADol (ULTRAM) 50 MG tablet Take 50 mg by mouth daily as needed for moderate pain.  03/10/16   [provider]    Family History No family history on file.  Social History Social History   Tobacco Use  . Smoking status: Current Every Day Smoker    Packs/day: 1.00    Types: Cigarettes  . Smokeless tobacco: Never Used  Substance Use Topics  . Alcohol use: No  . Drug use: Yes    Types: Marijuana     Allergies   Patient has no known allergies.   Review of Systems Review of Systems  Constitutional: Negative for appetite change and fatigue.  HENT: Negative for congestion, ear discharge and sinus pressure.   Eyes: Negative for discharge.  Respiratory: Negative for cough.   Cardiovascular: Positive for chest pain.  Gastrointestinal: Negative for abdominal pain and diarrhea.  Genitourinary: Negative for frequency and hematuria.  Musculoskeletal: Negative for back pain.  Skin: Negative for rash.  Neurological: Negative for seizures and headaches.  Psychiatric/Behavioral: Negative for hallucinations.     Physical Exam Updated Vital Signs BP 138/73 (BP Location: Left Arm)   Pulse 83   Temp (!) 97.2 F (36.2 C) (Oral)   Resp 16   Ht 5\' 8"  (1.727 m)   Wt 97.5 kg (215 lb)  SpO2 100%   BMI 32.69 kg/m   Physical Exam  Constitutional: He is oriented to person, place, and time. He appears well-developed.  HENT:  Head: Normocephalic.  Eyes: Conjunctivae and EOM are normal. No scleral icterus.  Neck: Neck supple. No thyromegaly present.  Cardiovascular: Normal rate and regular rhythm. Exam reveals no gallop and no friction rub.  No murmur heard. Pulmonary/Chest: No stridor. He has no wheezes. He has no rales. He exhibits tenderness.  Abdominal: He exhibits no distension. There is no tenderness. There is no rebound.    Musculoskeletal: Normal range of motion. He exhibits no edema.  Lymphadenopathy:    He has no cervical adenopathy.  Neurological: He is oriented to person, place, and time. He exhibits normal muscle tone. Coordination normal.  Skin: No rash noted. No erythema.  Psychiatric: He has a normal mood and affect. His behavior is normal.     ED Treatments / Results  Labs (all labs ordered are listed, but only abnormal results are displayed) Labs Reviewed  BASIC METABOLIC PANEL - Abnormal; Notable for the following components:      Result Value   Glucose, Bld 109 (*)    All other components within normal limits  CBC  I-STAT TROPONIN, ED    EKG EKG Interpretation  Date/Time:  Wednesday May 01 2018 09:30:19 EDT Ventricular Rate:  84 PR Interval:  148 QRS Duration: 78 QT Interval:  370 QTC Calculation: 437 R Axis:   -26 Text Interpretation:  Normal sinus rhythm Normal ECG Confirmed by Bethann Berkshire [phone]) on 05/01/2018 12:07:54 PM   Radiology Dg Chest 2 View  Result Date: 05/01/2018 CLINICAL DATA:  Stabbing anterior chest pain. EXAM: CHEST - 2 VIEW COMPARISON:  Chest CT 04/08/2018 FINDINGS: Cardiomediastinal silhouette is normal. Mediastinal contours appear intact. There is no evidence of focal airspace consolidation, pleural effusion or pneumothorax. Osseous structures are without acute abnormality. Soft tissues are grossly normal. IMPRESSION: No active cardiopulmonary disease. Electronically Signed   By: Ted Mcalpine M.D.   On: 05/01/2018 09:48    Procedures Procedures (including critical care time)  Medications Ordered in ED Medications - No data to display   Initial Impression / Assessment and Plan / ED Course  I have reviewed the triage vital signs and the nursing notes.  Pertinent labs & imaging results that were available during my care of the patient were reviewed by me and considered in my medical decision making (see chart for details).     CBC  chemistries troponin chest x-ray EKG all unremarkable.  Suspect chest pain is chest wall pain.  Patient will be placed on Ultram and told to take Tylenol first.  He will follow-up with his doctor for recheck in a couple weeks Final Clinical Impressions(s) / ED Diagnoses   Final diagnoses:  None    ED Discharge Orders    None       Bethann Berkshire, MD 05/01/18 1226

## 2018-05-02 ENCOUNTER — Encounter: Payer: Self-pay | Admitting: Internal Medicine

## 2018-05-03 LAB — COMPLETE METABOLIC PANEL WITH GFR
AG Ratio: 1.7 (calc) (ref 1.0–2.5)
ALBUMIN MSPROF: 4.1 g/dL (ref 3.6–5.1)
ALT: 34 U/L (ref 9–46)
AST: 22 U/L (ref 10–40)
Alkaline phosphatase (APISO): 84 U/L (ref 40–115)
BUN: 13 mg/dL (ref 7–25)
CALCIUM: 9 mg/dL (ref 8.6–10.3)
CO2: 24 mmol/L (ref 20–32)
CREATININE: 0.98 mg/dL (ref 0.60–1.35)
Chloride: 108 mmol/L (ref 98–110)
GFR, EST NON AFRICAN AMERICAN: 105 mL/min/{1.73_m2} (ref >=60)
GFR, Est African American: 122 mL/min/{1.73_m2} (ref >=60)
GLOBULIN: 2.4 g/dL (ref 1.9–3.7)
Glucose, Bld: 154 mg/dL — ABNORMAL HIGH (ref 65–99)
Potassium: 4 mmol/L (ref 3.5–5.3)
SODIUM: 139 mmol/L (ref 135–146)
TOTAL PROTEIN: 6.5 g/dL (ref 6.1–8.1)
Total Bilirubin: 0.3 mg/dL (ref 0.2–1.2)

## 2018-05-03 LAB — FLUORESCENT TREPONEMAL AB(FTA)-IGG-BLD: Fluorescent Treponemal ABS: REACTIVE — AB

## 2018-05-03 LAB — LIPID PANEL
CHOLESTEROL: 163 mg/dL (ref <200)
HDL: 59 mg/dL (ref >40)
LDL Cholesterol (Calc): 74 mg/dL (calc)
Non-HDL Cholesterol (Calc): 104 mg/dL (calc) (ref <130)
Total CHOL/HDL Ratio: 2.8 (calc) (ref <5.0)
Triglycerides: 200 mg/dL — ABNORMAL HIGH (ref <150)

## 2018-05-03 LAB — CBC WITH DIFFERENTIAL/PLATELET
BASOS PCT: 0.3 %
Basophils Absolute: 20 cells/uL (ref 0–200)
EOS ABS: 152 {cells}/uL (ref 15–500)
Eosinophils Relative: 2.3 %
HEMATOCRIT: 44.9 % (ref 38.5–50.0)
Hemoglobin: 15.4 g/dL (ref 13.2–17.1)
LYMPHS ABS: 2924 {cells}/uL (ref 850–3900)
MCH: 30.8 pg (ref 27.0–33.0)
MCHC: 34.3 g/dL (ref 32.0–36.0)
MCV: 89.8 fL (ref 80.0–100.0)
MPV: 9.3 fL (ref 7.5–12.5)
Monocytes Relative: 6.8 %
NEUTROS PCT: 46.3 %
Neutro Abs: 3056 cells/uL (ref 1500–7800)
PLATELETS: 259 10*3/uL (ref 140–400)
RBC: 5 10*6/uL (ref 4.20–5.80)
RDW: 12.9 % (ref 11.0–15.0)
TOTAL LYMPHOCYTE: 44.3 %
WBC: 6.6 10*3/uL (ref 3.8–10.8)
WBCMIX: 449 {cells}/uL (ref 200–950)

## 2018-05-03 LAB — HEPATITIS B SURFACE ANTIBODY,QUALITATIVE: HEP B S AB: REACTIVE — AB

## 2018-05-03 LAB — RPR TITER: RPR Titer: 1:1 {titer} — ABNORMAL HIGH

## 2018-05-03 LAB — HIV-1/2 AB - DIFFERENTIATION
HIV-1 antibody: POSITIVE — AB
HIV-2 Ab: NEGATIVE

## 2018-05-03 LAB — RPR: RPR Ser Ql: REACTIVE — AB

## 2018-05-03 LAB — HEPATITIS C ANTIBODY
HEP C AB: NONREACTIVE
SIGNAL TO CUT-OFF: 0.28 (ref <1.00)

## 2018-05-03 LAB — HEPATITIS B SURFACE ANTIGEN: HEP B S AG: NONREACTIVE

## 2018-05-03 LAB — HLA B*5701: HLA-B*5701 w/rflx HLA-B High: NEGATIVE

## 2018-05-03 LAB — HEPATITIS B CORE ANTIBODY, TOTAL: HEP B C TOTAL AB: NONREACTIVE

## 2018-05-03 LAB — HEPATITIS A ANTIBODY, TOTAL: Hepatitis A AB,Total: REACTIVE — AB

## 2018-05-03 LAB — HIV ANTIBODY (ROUTINE TESTING W REFLEX): HIV 1&2 Ab, 4th Generation: REACTIVE — AB

## 2018-05-04 LAB — HIV-1 RNA ULTRAQUANT REFLEX TO GENTYP+
HIV 1 RNA Quant: 109 copies/mL — ABNORMAL HIGH
HIV-1 RNA QUANT, LOG: 2.04 {Log_copies}/mL — AB

## 2018-05-09 ENCOUNTER — Telehealth: Payer: Self-pay

## 2018-05-09 ENCOUNTER — Telehealth: Payer: Self-pay | Admitting: *Deleted

## 2018-05-09 NOTE — Telephone Encounter (Signed)
Patient called for interpretation of his lab results that he saw on MyChart.  RN gave him general guidelines for lab results, encouraged him to keep his appointment 8/12 with Dr Orvan Falconerampbell to discuss his specific concerns. Patient noted that he restarted Biktarvy 04/04/18 after being off a few months due to depression. Andree CossHowell, Jamayia Croker M, RN

## 2018-05-09 NOTE — Telephone Encounter (Signed)
Patient called to move his appointment 05/13/18 to an earlier time due to pain. Marcelino DusterMichelle, RN advised patient to go to urgent care to be looked at. Patient ended the call without confirming advice to see urgent care.  Lorenso CourierJose L Migdalia Olejniczak, [place]

## 2018-05-10 ENCOUNTER — Encounter: Payer: Self-pay | Admitting: Internal Medicine

## 2018-05-13 ENCOUNTER — Ambulatory Visit (INDEPENDENT_AMBULATORY_CARE_PROVIDER_SITE_OTHER): Payer: Self-pay | Admitting: Internal Medicine

## 2018-05-13 ENCOUNTER — Other Ambulatory Visit: Payer: Self-pay | Admitting: *Deleted

## 2018-05-13 ENCOUNTER — Encounter: Payer: Self-pay | Admitting: Internal Medicine

## 2018-05-13 ENCOUNTER — Ambulatory Visit: Payer: Self-pay

## 2018-05-13 DIAGNOSIS — M2550 Pain in unspecified joint: Secondary | ICD-10-CM | POA: Insufficient documentation

## 2018-05-13 DIAGNOSIS — M25532 Pain in left wrist: Secondary | ICD-10-CM

## 2018-05-13 DIAGNOSIS — F4310 Post-traumatic stress disorder, unspecified: Secondary | ICD-10-CM

## 2018-05-13 DIAGNOSIS — A528 Late syphilis, latent: Secondary | ICD-10-CM | POA: Insufficient documentation

## 2018-05-13 DIAGNOSIS — F319 Bipolar disorder, unspecified: Secondary | ICD-10-CM

## 2018-05-13 DIAGNOSIS — F17210 Nicotine dependence, cigarettes, uncomplicated: Secondary | ICD-10-CM

## 2018-05-13 DIAGNOSIS — R739 Hyperglycemia, unspecified: Secondary | ICD-10-CM

## 2018-05-13 DIAGNOSIS — E66811 Obesity, class 1: Secondary | ICD-10-CM

## 2018-05-13 DIAGNOSIS — B20 Human immunodeficiency virus [HIV] disease: Secondary | ICD-10-CM

## 2018-05-13 DIAGNOSIS — E669 Obesity, unspecified: Secondary | ICD-10-CM

## 2018-05-13 DIAGNOSIS — M25531 Pain in right wrist: Secondary | ICD-10-CM

## 2018-05-13 MED ORDER — BICTEGRAVIR-EMTRICITAB-TENOFOV 50-200-25 MG PO TABS: 1.0000 | ORAL_TABLET | Freq: Every day | ORAL | 11 refills | Status: DC

## 2018-05-13 MED ORDER — BICTEGRAVIR-EMTRICITAB-TENOFOV 50-200-25 MG PO TABS: 1.0000 | ORAL_TABLET | Freq: Every day | ORAL | 11 refills | Status: AC

## 2018-05-13 NOTE — Progress Notes (Signed)
Patient Active Problem List   Diagnosis Date Noted  . HIV disease ([place]) 05/13/2018    Priority: High  . Bipolar 1 disorder ([place]) 05/13/2018  . PTSD (post-traumatic stress disorder) 05/13/2018  . 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 Hyperglycemia 05/13/2018  . Cigarette smoker 05/13/2018  . Obesity (BMI 30.0-34.9) 05/13/2018  . Arthralgia 05/13/2018    Patient's Medications  New Prescriptions   BICTEGRAVIR-EMTRICITABINE-TENOFOVIR AF (BIKTARVY) 50-200-25 MG TABS TABLET    Take 1 tablet by mouth daily.  Previous Medications   DIPHENOXYLATE-ATROPINE (LOMOTIL) 2.5-0.025 MG TABLET    One tablet po q 4 prn diarrhea   DIVALPROEX (DEPAKOTE) 250 MG DR TABLET    Take 250 mg by mouth 2 (two) times daily.   OMEPRAZOLE (PRILOSEC) 20 MG CAPSULE    Take 1 capsule (20 mg total) by mouth daily.   TRAMADOL (ULTRAM) 50 MG TABLET    Take 1 tablet (50 mg total) by mouth every 6 (six) hours as needed.  Modified Medications   No medications on file  Discontinued Medications   ABACAVIR-DOLUTEGRAVIR-LAMIVUDINE (TRIUMEQ) 600-50-300 MG TABLET    Take 1 tablet by mouth at bedtime.   AZITHROMYCIN (ZITHROMAX) 250 MG TABLET    Take 1 tablet (250 mg total) by mouth daily. Take first 2 tablets together, then 1 every day until finished.    Subjective: Jeremy Ortiz is in for his initial visit to establish ongoing care for HIV infection.  He was diagnosed in 2016 when donating plasma.  He entered into care and was started on Stribild.  He is not sure what his initial CD4 and viral load were.  He is not aware of ever having any complications related to his infection.  He transferred his care to [facility] in April 2017.  His CD4 count was 310 and his viral load was undetectable at less than 20 at that time.  He was changed to Triumeq.  He does not recall having problems tolerating Stribild.  He moved to [place] in late 2017 and entered into care there.  This spring he was changed to [employer].  He says he  is not sure why the change was made.  He does not recall ever being told that his medications stopped working or that there was resistance.  He thinks they may have changed his regimen because he was having headaches.  He did not notice any change in his headaches after changing to [facility] but he stopped taking it after 2 weeks because he was having worsening depression.  He moved back home to live with his mother because of the depression.  He started back on his Biktarvy 1 month ago.  He estimates that he is missed 5-6 doses in the past month when he forgot to take them.  He recalls that his viral load had gone up to 88,000 off medication just before he left [place] to return to [place].  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  He is not on any other medications.  He is not seeing a Social worker.  He says he smokes 2 marijuana blunts daily.  He says that it takes his pain away (he has intermittent headaches and diffuse arthralgias) and makes it easier to not think about his HIV infection.  He was treated for syphilis with one-time injection of penicillin while in [place].  He is married but separated.  He is not in any other  relationship at this time and has not been sexually active recently.  He has a history of bipolar disorder and PTSD.  He was hospitalized in 2013 after expressing suicidal ideations.  He has a glucometer at home and has been checking his blood sugars which he says have been running between 150 and 200.  He has never been told that he was diabetic.  His mother tells me that she is prediabetic.  Review of Systems: Review of Systems  Constitutional: Positive for malaise/fatigue. Negative for chills, diaphoresis, fever and weight loss.  HENT: Negative for sore throat.   Respiratory: Negative for cough, sputum production and shortness of breath.   Cardiovascular: Negative for chest pain.  Gastrointestinal: Negative for abdominal pain,  diarrhea, heartburn, nausea and vomiting.  Genitourinary: Negative for dysuria and frequency.  Musculoskeletal: Positive for joint pain. Negative for myalgias.  Skin: Negative for rash.  Neurological: Positive for headaches. Negative for dizziness.  Psychiatric/Behavioral: Positive for depression and substance abuse. Negative for suicidal ideas. The patient has insomnia. The patient is not nervous/anxious.        He has trouble falling asleep.  Over the last several months he has had problems with daytime somnolence.  He will sometimes fall asleep during the conversation or when driving.    Past Medical History:  Diagnosis Date  . Depression   . HIV positive ([place])   . PTSD (post-traumatic stress disorder)     Social History   Tobacco Use  . Smoking status: Current Every Day Smoker    Packs/day: 1.00    Types: Cigarettes  . Smokeless tobacco: Never Used  Substance Use Topics  . Alcohol use: No  . Drug use: Yes    Types: Marijuana    Family History  Problem Relation Age of Onset  . Diabetes Mother     No Known Allergies  Health Maintenance  Topic Date Due  . TETANUS/TDAP  12/07/2009  . INFLUENZA VACCINE  05/02/2018  . HIV Screening  Completed    Objective:  Vitals:   05/13/18 1353  BP: 121/78  Pulse: (!) 121  Temp: 98.6 F (37 C)  Weight: 220 lb 12.8 oz (100.2 kg)   Body mass index is 33.57 kg/m.  Physical Exam  Constitutional: He is oriented to person, place, and time.  He is pleasant and in no distress.  HENT:  Mouth/Throat: No oropharyngeal exudate.  Eyes: Conjunctivae are normal.  Cardiovascular: Normal rate, regular rhythm and normal heart sounds.  No murmur heard. Pulmonary/Chest: Effort normal and breath sounds normal. He has no wheezes. He has no rales.  Abdominal: Soft. He exhibits no mass. There is no tenderness.  Musculoskeletal: Normal range of motion.  Neurological: He is alert and oriented to person, place, and time.  Skin: No rash  noted.  Psychiatric: He has a normal mood and affect.    Lab Results Lab Results  Component Value Date   WBC 6.7 05/01/2018   HGB 15.6 05/01/2018   HCT 47.4 05/01/2018   MCV 92.0 05/01/2018   PLT 243 05/01/2018    Lab Results  Component Value Date   CREATININE 1.08 05/01/2018   BUN 9 05/01/2018   NA 138 05/01/2018   K 3.9 05/01/2018   CL 105 05/01/2018   CO2 25 05/01/2018    Lab Results  Component Value Date   ALT 34 04/29/2018   AST 22 04/29/2018   ALKPHOS 84 04/12/2016   BILITOT 0.3 04/29/2018    Lab Results  Component Value Date  CHOL 163 04/29/2018   HDL 59 04/29/2018   LDLCALC 74 04/29/2018   TRIG 200 (H) 04/29/2018   CHOLHDL 2.8 04/29/2018   Lab Results  Component Value Date   LABRPR REACTIVE (A) 04/29/2018   RPRTITER 1:1 (H) 04/29/2018   HIV 1 RNA Quant (copies/mL)  Date Value  04/29/2018 109 (H)   CD4 T Cell Abs (/uL)  Date Value  04/29/2018 1,330  04/12/2016 1,770     Problem List Items Addressed This Visit      High   HIV disease ([place])    His infection is coming back under control after restarting Biktarvy 1 month ago but he is missing too many doses.  I have talked to him Jeremy Ortiz and his mother about the utmost importance of doing better with adherence.  I have asked him to do his best to not miss doses between now and his follow-up visit in 6 weeks.  I told him that if he will stay in care and on therapy he should never have complications related to HIV.  I have warned him about the possibility of acquiring other, more resistant strains of HIV from future partners.  I have asked him to be careful to limit the number of partners he has in the future to be very careful about who those partners are, have them tested and to use condoms.       Relevant Orders   T-helper cell (CD4)- ([facility] clinic only)   HIV 1 RNA quant-no reflex-bld     Unprioritized   Arthralgia   Bipolar 1 disorder ([place])    I have made a referral for him to meet with Lillie Fragmin, our behavioral health counselor.  He met with his case manager today while here.      Cigarette smoker   Hyperglycemia    We will help him find a primary care provider.  He needs help with hyperglycemia, obesity, cigarette use, chronic pain insomnia and daytime somnolence.      Late latent syphilis   Obesity (BMI 30.0-34.9)   PTSD (post-traumatic stress disorder)        Michel Bickers, MD [facility] [phone] pager   [phone] cell 05/13/2018, 4:23 PM

## 2018-05-13 NOTE — Assessment & Plan Note (Signed)
I have made a referral for him to meet with Lillie Fragmin, our behavioral health counselor.  He met with his case manager today while here.

## 2018-05-13 NOTE — Assessment & Plan Note (Signed)
His infection is coming back under control after restarting Biktarvy 1 month ago but he is missing too many doses.  I have talked to him Jeremy Ortiz and his mother about the utmost importance of doing better with adherence.  I have asked him to do his best to not miss doses between now and his follow-up visit in 6 weeks.  I told him that if he will stay in care and on therapy he should never have complications related to HIV.  I have warned him about the possibility of acquiring other, more resistant strains of HIV from future partners.  I have asked him to be careful to limit the number of partners he has in the future to be very careful about who those partners are, have them tested and to use condoms.

## 2018-05-13 NOTE — Assessment & Plan Note (Signed)
We will help him find a primary care provider.  He needs help with hyperglycemia, obesity, cigarette use, chronic pain insomnia and daytime somnolence.

## 2018-05-15 ENCOUNTER — Other Ambulatory Visit: Payer: Self-pay | Admitting: *Deleted

## 2018-05-15 DIAGNOSIS — R739 Hyperglycemia, unspecified: Secondary | ICD-10-CM

## 2018-05-15 DIAGNOSIS — E669 Obesity, unspecified: Secondary | ICD-10-CM

## 2018-05-15 NOTE — Progress Notes (Signed)
ambul

## 2018-06-24 ENCOUNTER — Other Ambulatory Visit: Payer: Self-pay

## 2018-06-24 DIAGNOSIS — B20 Human immunodeficiency virus [HIV] disease: Secondary | ICD-10-CM

## 2018-06-25 ENCOUNTER — Ambulatory Visit (INDEPENDENT_AMBULATORY_CARE_PROVIDER_SITE_OTHER): Payer: Self-pay | Admitting: Internal Medicine

## 2018-06-25 VITALS — BP 128/69 | HR 58 | Temp 97.8°F | Wt 230.3 lb

## 2018-06-25 DIAGNOSIS — F319 Bipolar disorder, unspecified: Secondary | ICD-10-CM

## 2018-06-25 DIAGNOSIS — M25572 Pain in left ankle and joints of left foot: Secondary | ICD-10-CM

## 2018-06-25 DIAGNOSIS — E669 Obesity, unspecified: Secondary | ICD-10-CM

## 2018-06-25 DIAGNOSIS — R739 Hyperglycemia, unspecified: Secondary | ICD-10-CM

## 2018-06-25 DIAGNOSIS — M25532 Pain in left wrist: Secondary | ICD-10-CM

## 2018-06-25 DIAGNOSIS — Z23 Encounter for immunization: Secondary | ICD-10-CM

## 2018-06-25 DIAGNOSIS — A528 Late syphilis, latent: Secondary | ICD-10-CM

## 2018-06-25 DIAGNOSIS — Z79899 Other long term (current) drug therapy: Secondary | ICD-10-CM

## 2018-06-25 DIAGNOSIS — B20 Human immunodeficiency virus [HIV] disease: Secondary | ICD-10-CM

## 2018-06-25 DIAGNOSIS — M25542 Pain in joints of left hand: Secondary | ICD-10-CM

## 2018-06-25 DIAGNOSIS — Z6835 Body mass index (BMI) 35.0-35.9, adult: Secondary | ICD-10-CM

## 2018-06-25 DIAGNOSIS — M25531 Pain in right wrist: Secondary | ICD-10-CM

## 2018-06-25 DIAGNOSIS — Z833 Family history of diabetes mellitus: Secondary | ICD-10-CM

## 2018-06-25 DIAGNOSIS — M25571 Pain in right ankle and joints of right foot: Secondary | ICD-10-CM

## 2018-06-25 DIAGNOSIS — F17211 Nicotine dependence, cigarettes, in remission: Secondary | ICD-10-CM

## 2018-06-25 DIAGNOSIS — Z8619 Personal history of other infectious and parasitic diseases: Secondary | ICD-10-CM

## 2018-06-25 DIAGNOSIS — M25541 Pain in joints of right hand: Secondary | ICD-10-CM

## 2018-06-25 LAB — T-HELPER CELL (CD4) - (RCID CLINIC ONLY)
CD4 % Helper T Cell: 43 % (ref 33–55)
CD4 T Cell Abs: 1430 /uL (ref 400–2700)

## 2018-06-25 LAB — POCT GLYCOSYLATED HEMOGLOBIN (HGB A1C): Hemoglobin A1C: 5.6 % (ref 4.0–5.6)

## 2018-06-25 LAB — GLUCOSE, CAPILLARY: GLUCOSE-CAPILLARY: 116 mg/dL — AB (ref 70–99)

## 2018-06-25 MED ORDER — WRIST BRACE MISC: 0 refills | Status: AC

## 2018-06-25 MED ORDER — ACETAMINOPHEN ER 650 MG PO TBCR: 650.0000 mg | EXTENDED_RELEASE_TABLET | Freq: Three times a day (TID) | ORAL | 2 refills | Status: AC | PRN

## 2018-06-25 NOTE — Patient Instructions (Addendum)
Thank you for visiting clinic today. As we discussed you are not diabetic. Try to eat healthy and lose some weight that will help you down the road. For your wrist pain you can use Tylenol 650 mg every 6-8 hourly, I am also giving you a wrist brace to be used while working. You need to see a psychiatrist for your depression and bipolar needs, you are provided with information regarding going to [facility] which is a walk-in free clinic. Please go there at your earliest convenience. You can come back and see us if you need anything, otherwise follow-up in 3 month.

## 2018-06-25 NOTE — Assessment & Plan Note (Signed)
Patient has very nonspecific complaints of arthralgia, no red flags with excessive stiffness, erythema, hyperthermia, or edema or any deformity. Most likely exertional. He was having completely benign exam.  Jeremy Ortiz has a drug interaction with NSAID. Give him Tylenol 650 mg to be taken 6-8 hourly if needed. Also told him to use wrist brace while working and see if that will help.

## 2018-06-25 NOTE — Assessment & Plan Note (Signed)
Patient has an history of syphilis which was treated with penicillin. RPR positive at 1:1.  Infectious disease is going to monitor his RPR.

## 2018-06-25 NOTE — Assessment & Plan Note (Signed)
According to patient he has a diagnosis of bipolar 1 disorder. PHQ 9 score today was 22.  He was provided with information to go to [facility] for further evaluation and treatment of his depression, as starting him on on antidepressants without any mood stabilizer can cause further harm.

## 2018-06-25 NOTE — Assessment & Plan Note (Signed)
A1c done in clinic today was 5.6 with CBG of 116. Per chart review he has one reading of blood sugar at 154, not sure whether it was fasting or not. Denies any polyuria or polydipsia.  He is high risk because of obesity and family history.  Discussed lifestyle modifications and watching the carbohydrate content of his diet. We can repeat A1c in 4041-month.

## 2018-06-25 NOTE — Assessment & Plan Note (Signed)
Provided with flu shot. 

## 2018-06-25 NOTE — Assessment & Plan Note (Signed)
He is compliant with his Biktarvy with undetectable viral load and CD count of 1430.  Adviced him to continue follow-up with infectious disease.

## 2018-06-25 NOTE — Progress Notes (Signed)
   CC: To establish care and for evaluation of hyperglycemia from [facility].  HPI:  Mr.Lola Earlene PlaterDavis is a 27 y.o. patient with past medical history significant for HIV and syphilis was sent to our clinic from infectious disease clinic to establish care and after finding some hyperglycemia.  The patient he was once told about being bipolar and PTSD, chart shows a prescription of Depakote and Lamictal which he was not using it for more than a year.  His PHQ 9 score was 22 today. He denies any suicidal ideations.  He was complaining of arthralgia which include bilateral pain around the ankle after standing for long time, bilateral pain in his hands and wrist joints worse on the right.  He denies any swelling, redness, stiffness or low-grade fever.  Patient recently moved back from [place] and now living with his mother and working as a part-time job at [employer].  He has no other complaints.  Please see assessment and plan for his conditions.  Past Medical History:  Diagnosis Date  . Depression   . HIV positive (H[facility])   . PTSD (post-traumatic stress disorder)    Family history.  Grandmother and one aunt with diabetes.  Social history.  Recently quit smoking 2 weeks ago. To smoke 1-1/2 pack/day. Denies any alcohol use. Uses marijuana 1-2 times per week.  Review of Systems: Active except mentioned in HPI.  Physical Exam:  Vitals:   06/25/18 1053  BP: 128/69  Pulse: (!) 58  Temp: 97.8 F (36.6 C)  TempSrc: Oral  SpO2: 99%  Weight: 230 lb 4.8 oz (104.5 kg)   General: Vital signs reviewed.  Patient is well-developed and well-nourished, in no acute distress and cooperative with exam.  Head: Normocephalic and atraumatic. Eyes: EOMI, conjunctivae normal, no scleral icterus.  Neck: Supple, trachea midline, normal ROM, no JVD, masses, thyromegaly, or carotid bruit present.  Cardiovascular: RRR, S1 normal, S2 normal, no murmurs, gallops, or rubs. Pulmonary/Chest: Clear to  auscultation bilaterally, no wheezes, rales, or rhonchi. Abdominal: Soft, non-tender, non-distended, BS +,  Musculoskeletal.  No erythema, hyperthermia, tenderness or edema of around hands joint or wrist.  Tinel and Finnell sign negative.  Strength and sensations intact.  Nonrestricted range of motion. Extremities: No lower extremity edema bilaterally,  pulses symmetric and intact bilaterally. No cyanosis or clubbing. Skin: Warm, dry and intact. No rashes or erythema. Psychiatric: Normal mood and affect. speech and behavior is normal. Cognition and memory are normal.  Assessment & Plan:   See Encounters Tab for problem based charting.  Patient discussed with Dr. Sandre Kittyaines.

## 2018-06-26 LAB — HIV-1 RNA QUANT-NO REFLEX-BLD
HIV 1 RNA QUANT: DETECTED {copies}/mL — AB
HIV-1 RNA QUANT, LOG: DETECTED {Log_copies}/mL — AB

## 2018-06-26 NOTE — Progress Notes (Signed)
Internal Medicine Clinic Attending  Case discussed with Dr. Amin at the time of the visit.  We reviewed the resident's history and exam and pertinent patient test results.  I agree with the assessment, diagnosis, and plan of care documented in the resident's note.  Alexander Raines, M.D., Ph.D.  

## 2018-07-09 ENCOUNTER — Ambulatory Visit (INDEPENDENT_AMBULATORY_CARE_PROVIDER_SITE_OTHER): Payer: Self-pay | Admitting: Licensed Clinical Social Worker

## 2018-07-09 ENCOUNTER — Ambulatory Visit (INDEPENDENT_AMBULATORY_CARE_PROVIDER_SITE_OTHER): Payer: Self-pay | Admitting: Internal Medicine

## 2018-07-09 ENCOUNTER — Encounter: Payer: Self-pay | Admitting: Internal Medicine

## 2018-07-09 DIAGNOSIS — F319 Bipolar disorder, unspecified: Secondary | ICD-10-CM

## 2018-07-09 DIAGNOSIS — F4312 Post-traumatic stress disorder, chronic: Secondary | ICD-10-CM

## 2018-07-09 DIAGNOSIS — B20 Human immunodeficiency virus [HIV] disease: Secondary | ICD-10-CM

## 2018-07-09 DIAGNOSIS — F17210 Nicotine dependence, cigarettes, uncomplicated: Secondary | ICD-10-CM

## 2018-07-09 NOTE — Assessment & Plan Note (Signed)
His infection is under very good control.  He will continue Biktarvy and follow-up after lab work in 6 months.

## 2018-07-09 NOTE — Assessment & Plan Note (Signed)
I congratulated him on quitting cigarettes.  I encouraged him to go through with his plan to join a gym and exercise more.

## 2018-07-09 NOTE — Progress Notes (Signed)
Patient Active Problem List   Diagnosis Date Noted  . HIV disease (HCC) 05/13/2018    Priority: High  . Need for immunization against influenza 06/25/2018  . Bipolar 1 disorder (HCC) 05/13/2018  . PTSD (post-traumatic stress disorder) 05/13/2018  . 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 Hyperglycemia 05/13/2018  . Cigarette smoker 05/13/2018  . Obesity (BMI 30.0-34.9) 05/13/2018  . Arthralgia 05/13/2018    Patient's Medications  New Prescriptions   No medications on file  Previous Medications   ACETAMINOPHEN (TYLENOL 8 HOUR) 650 MG CR TABLET    Take 1 tablet (650 mg total) by mouth every 8 (eight) hours as needed for pain.   BICTEGRAVIR-EMTRICITABINE-TENOFOVIR AF (BIKTARVY) 50-200-25 MG TABS TABLET    Take 1 tablet by mouth daily.   MISC. DEVICES (WRIST BRACE) MISC    Use around your right wrist while working.  Modified Medications   No medications on file  Discontinued Medications   No medications on file    Subjective: Jeremy Ortiz is in for his routine HIV follow-up visit.  He has had no problems obtaining, taking or tolerating his Biktarvy.  He takes it each day around 12:30 PM.  He does not recall missing any doses since his last visit.  He quit smoking cigarettes 3 weeks ago.  He is starting to walk for exercise and is considering joining a gym.  He is not in a relationship and has not been sexually active.  He has not been back to [place] in quite a while now.  He is feeling a little bit more anxious than normal.  He says that his episodes of depression are intermittent.  Review of Systems: Review of Systems  Constitutional: Negative for chills, diaphoresis, fever, malaise/fatigue and weight loss.  HENT: Negative for sore throat.   Respiratory: Negative for cough, sputum production and shortness of breath.   Cardiovascular: Negative for chest pain.  Gastrointestinal: Negative for abdominal pain, diarrhea, heartburn, nausea and vomiting.  Genitourinary: Negative for  dysuria and frequency.  Musculoskeletal: Negative for joint pain and myalgias.  Skin: Negative for rash.  Neurological: Negative for dizziness and headaches.  Psychiatric/Behavioral: Positive for depression. Negative for substance abuse and suicidal ideas. The patient is nervous/anxious.     Past Medical History:  Diagnosis Date  . Depression   . HIV positive (H[facility])   . PTSD (post-traumatic stress disorder)     Social History   Tobacco Use  . Smoking status: Former Smoker    Packs/day: 1.00    Types: Cigarettes  . Smokeless tobacco: Never Used  . Tobacco comment: havent smoked in 2wks  Substance Use Topics  . Alcohol use: No  . Drug use: Yes    Types: Marijuana    Family History  Problem Relation Age of Onset  . Diabetes Mother     No Known Allergies  Health Maintenance  Topic Date Due  . TETANUS/TDAP  12/07/2009  . INFLUENZA VACCINE  Completed  . HIV Screening  Completed    Objective:  Vitals:   07/09/18 1433  BP: 126/80  Pulse: 79  Temp: 98.3 F (36.8 C)  Weight: 229 lb 12.8 oz (104.2 kg)   Body mass index is 34.94 kg/m.  Physical Exam  Constitutional: He is oriented to person, place, and time.  He is in good spirits.  HENT:  Mouth/Throat: No oropharyngeal exudate.  Eyes: Conjunctivae are normal.  Cardiovascular: Normal rate, regular rhythm and normal heart sounds.  No murmur  heard. Pulmonary/Chest: Effort normal and breath sounds normal.  Abdominal: Soft. He exhibits no mass. There is no tenderness.  Musculoskeletal: Normal range of motion.  Neurological: He is alert and oriented to person, place, and time.  Skin: No rash noted.  Psychiatric: He has a normal mood and affect.    Lab Results Lab Results  Component Value Date   WBC 6.7 05/01/2018   HGB 15.6 05/01/2018   HCT 47.4 05/01/2018   MCV 92.0 05/01/2018   PLT 243 05/01/2018    Lab Results  Component Value Date   CREATININE 1.08 05/01/2018   BUN 9 05/01/2018   NA 138 05/01/2018    K 3.9 05/01/2018   CL 105 05/01/2018   CO2 25 05/01/2018    Lab Results  Component Value Date   ALT 34 04/29/2018   AST 22 04/29/2018   ALKPHOS 84 04/12/2016   BILITOT 0.3 04/29/2018    Lab Results  Component Value Date   CHOL 163 04/29/2018   HDL 59 04/29/2018   LDLCALC 74 04/29/2018   TRIG 200 (H) 04/29/2018   CHOLHDL 2.8 04/29/2018   Lab Results  Component Value Date   LABRPR REACTIVE (A) 04/29/2018   RPRTITER 1:1 (H) 04/29/2018   HIV 1 RNA Quant (copies/mL)  Date Value  06/24/2018 <20 DETECTED (A)  04/29/2018 109 (H)   CD4 T Cell Abs (/uL)  Date Value  06/24/2018 1,430  04/29/2018 1,330  04/12/2016 1,770     Problem List Items Addressed This Visit      High   HIV disease (H[facility])    His infection is under very good control.  He will continue Biktarvy and follow-up after lab work in 6 months.      Relevant Orders   T-helper cell (CD4)- ([facility] clinic only)   HIV-1 RNA quant-no reflex-bld   CBC   Comprehensive metabolic panel   Lipid panel   RPR     Unprioritized   Cigarette smoker    I congratulated him on quitting cigarettes.  I encouraged him to go through with his plan to join a gym and exercise more.      Bipolar 1 disorder (HCC)    I had him meet with Angus Palms, our behavioral health counselor, today.           Cliffton Asters, MD [facility] [phone] pager   [phone] cell 07/09/2018, 2:50 PM
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
## 2018-07-09 NOTE — Assessment & Plan Note (Signed)
I had him meet with Angus Palms, our behavioral health counselor, today.

## 2018-07-24 ENCOUNTER — Encounter: Payer: Self-pay | Admitting: Internal Medicine

## 2018-07-30 ENCOUNTER — Encounter: Payer: Self-pay | Admitting: *Deleted

## 2018-07-30 ENCOUNTER — Ambulatory Visit: Payer: Self-pay

## 2018-08-06 ENCOUNTER — Institutional Professional Consult (permissible substitution): Payer: Self-pay | Admitting: Licensed Clinical Social Worker

## 2019-01-06 ENCOUNTER — Other Ambulatory Visit: Payer: Self-pay

## 2019-01-06 ENCOUNTER — Ambulatory Visit: Payer: Self-pay

## 2019-01-20 ENCOUNTER — Ambulatory Visit: Payer: Self-pay

## 2019-01-20 ENCOUNTER — Telehealth: Payer: Self-pay

## 2019-01-20 ENCOUNTER — Encounter: Payer: Self-pay | Admitting: Internal Medicine

## 2019-01-20 NOTE — Telephone Encounter (Signed)
Spoke with patient to remind him of upcoming appointment scheduled today at 2pm. Patient states that he has moved to [facility] and is already established care in [facility]. Advised patient to contact our office if he needed any records faxed to new office and explained to patient that [facility] would need a ROI form signed prior to releasing any records. Patient states he will contact [facility] if needed. LPN cancelled appointment today.  Routing to MD to make aware of care transfer.  Valarie Cones, LPN

## 2019-03-23 ENCOUNTER — Encounter: Payer: Self-pay | Admitting: *Deleted

## 2019-05-21 ENCOUNTER — Other Ambulatory Visit: Payer: Self-pay | Admitting: Internal Medicine

## 2019-05-21 DIAGNOSIS — B20 Human immunodeficiency virus [HIV] disease: Secondary | ICD-10-CM

## 2019-10-25 IMAGING — CR DG CHEST 2V
2 series · 2 of 2 positions shown · non-contrast
Comparison: Chest CT 04/08/2018

CLINICAL DATA: Stabbing anterior chest pain.

EXAM:
CHEST - 2 VIEW

[chest pa]
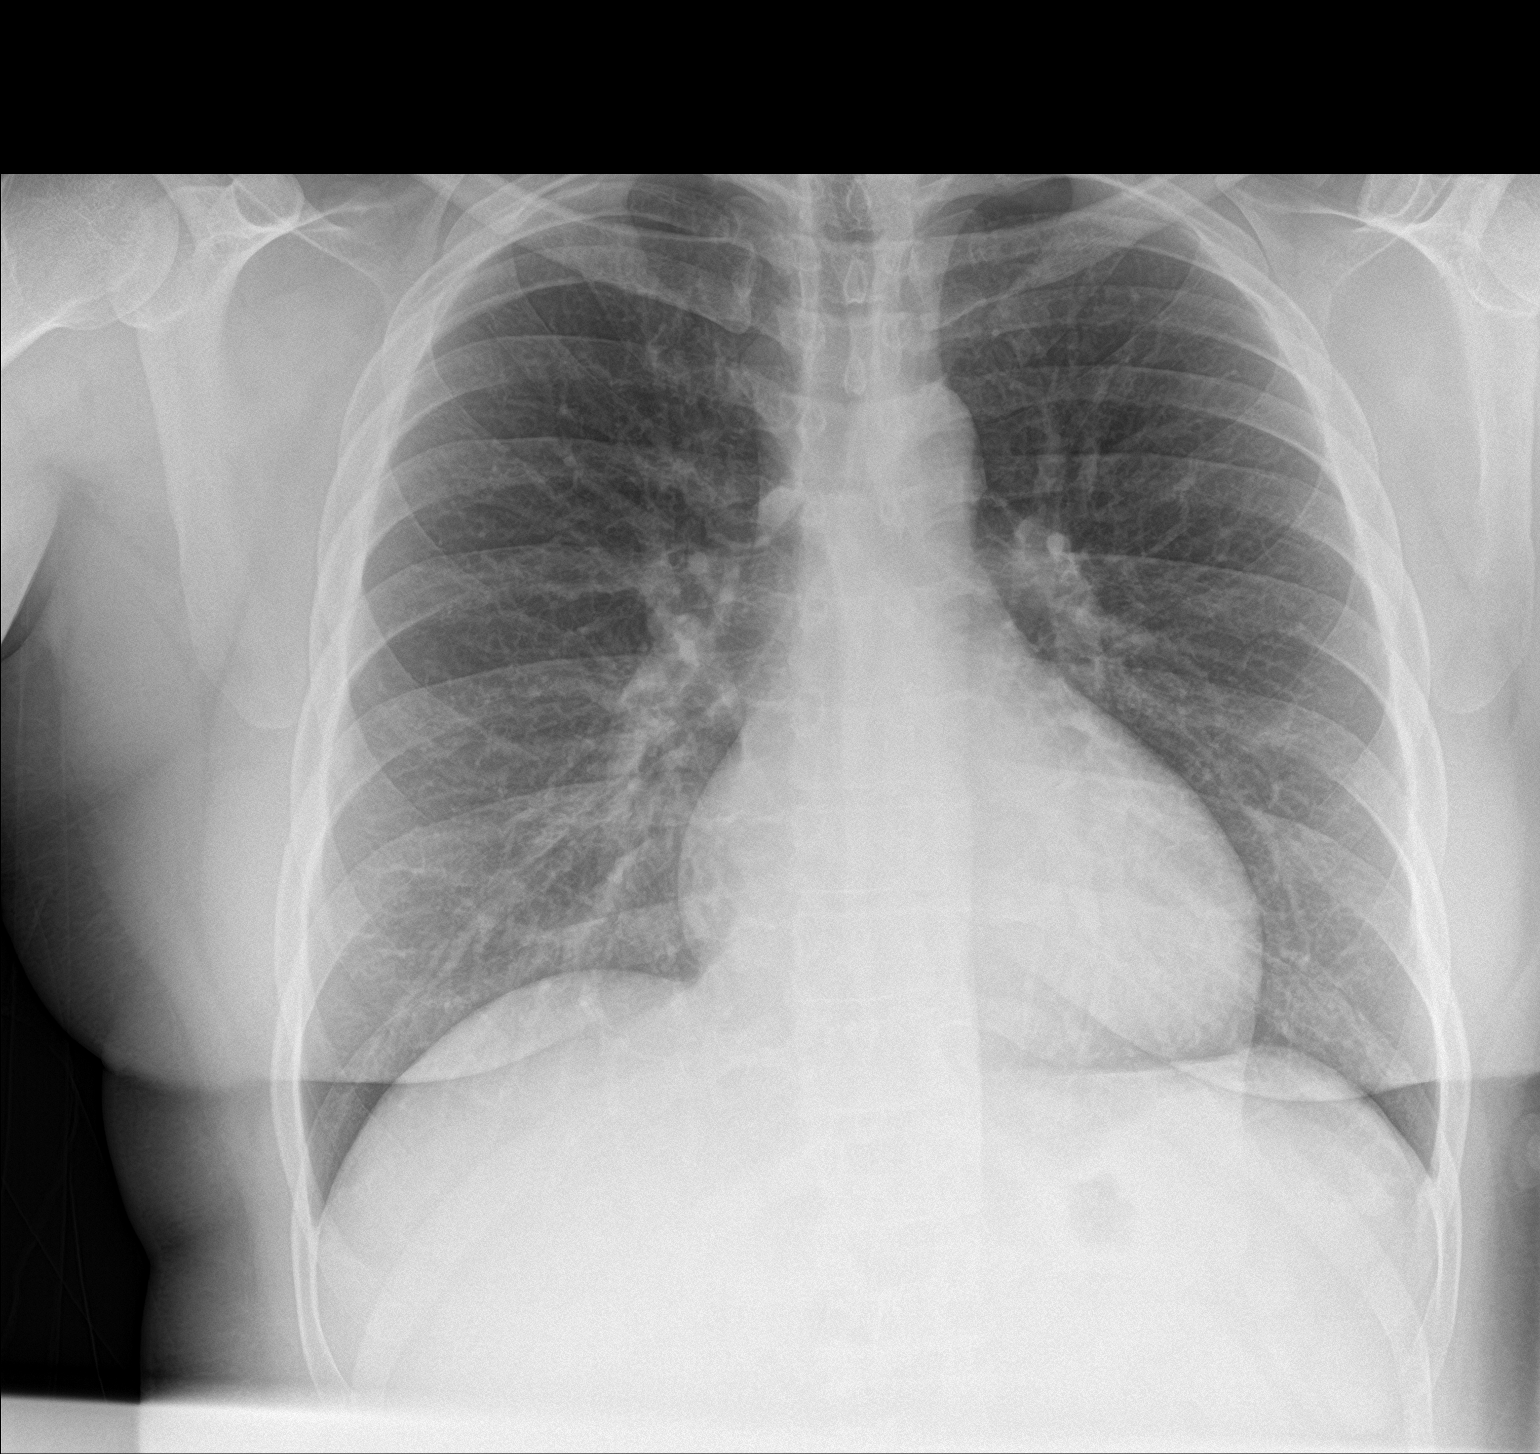

[chest lat]
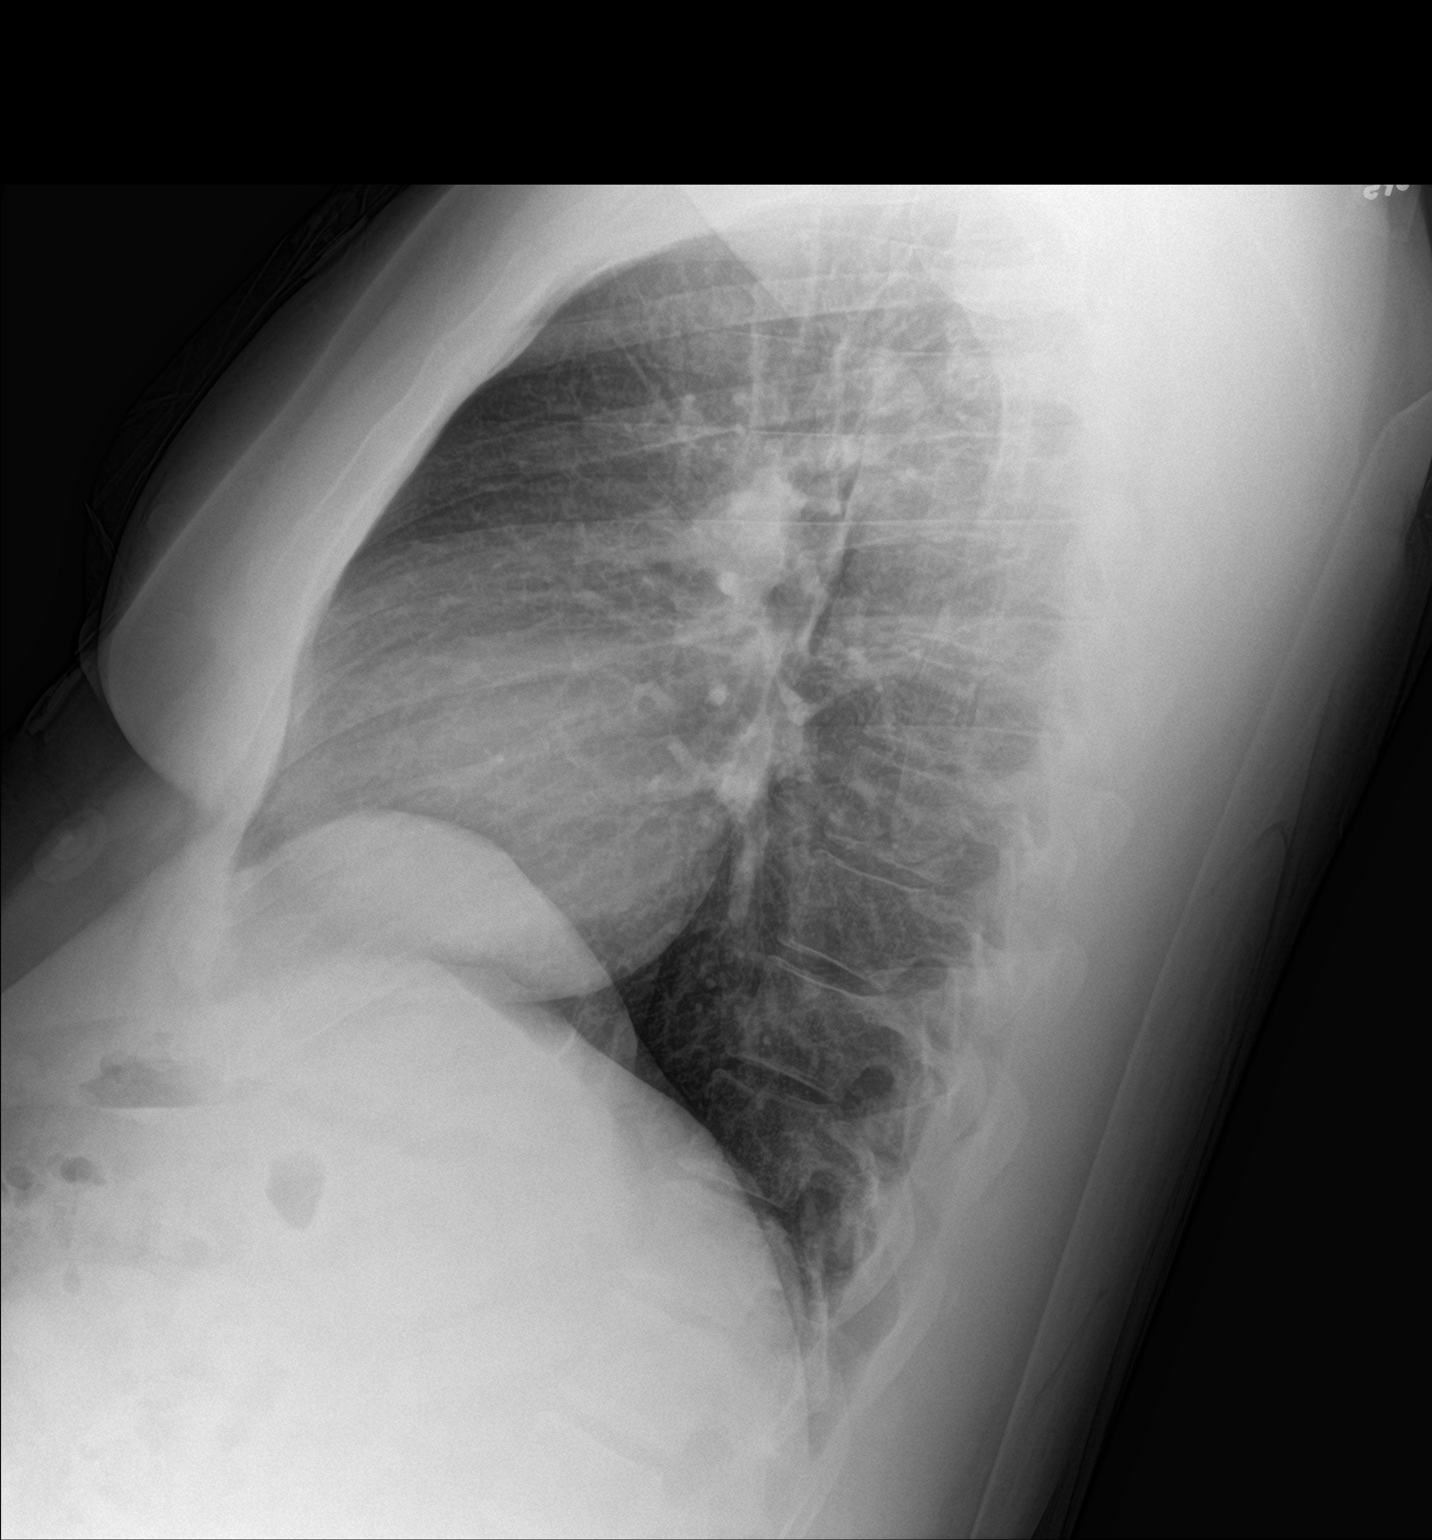

[2 of 2 positions shown; findings below may reference images not displayed]

FINDINGS: Cardiomediastinal silhouette is normal. Mediastinal contours appear
intact.

There is no evidence of focal airspace consolidation, pleural
effusion or pneumothorax.

Osseous structures are without acute abnormality. Soft tissues are
grossly normal.
IMPRESSION: No active cardiopulmonary disease.
